# Patient Record
Sex: Female | Born: 1970 | Race: White | Hispanic: No | Marital: Married | Smoking: Former smoker
Health system: Southern US, Community
[De-identification: ages and names within clinical notes are randomized; demographics above are authoritative.]

## PROBLEM LIST (undated history)

## (undated) DIAGNOSIS — I10 Essential (primary) hypertension: Secondary | ICD-10-CM

---

## 2012-03-20 ENCOUNTER — Other Ambulatory Visit: Payer: Self-pay

## 2012-06-02 ENCOUNTER — Other Ambulatory Visit (HOSPITAL_COMMUNITY): Payer: Self-pay | Admitting: Physician Assistant

## 2012-06-02 DIAGNOSIS — I10 Essential (primary) hypertension: Secondary | ICD-10-CM

## 2012-06-02 DIAGNOSIS — O09529 Supervision of elderly multigravida, unspecified trimester: Secondary | ICD-10-CM

## 2012-06-02 DIAGNOSIS — Z3689 Encounter for other specified antenatal screening: Secondary | ICD-10-CM

## 2012-06-04 ENCOUNTER — Ambulatory Visit (HOSPITAL_COMMUNITY)
Admission: RE | Admit: 2012-06-04 | Discharge: 2012-06-04 | Disposition: A | Discharge status: Home / Self Care | Payer: Managed Care, Other (non HMO) | Source: Ambulatory Visit | Attending: Physician Assistant | Admitting: Physician Assistant

## 2012-06-04 ENCOUNTER — Ambulatory Visit (HOSPITAL_COMMUNITY): Admission: RE | Admit: 2012-06-04 | Payer: Managed Care, Other (non HMO) | Source: Ambulatory Visit

## 2012-06-04 ENCOUNTER — Encounter (HOSPITAL_COMMUNITY): Payer: Self-pay

## 2012-06-04 VITALS — BP 133/78 | HR 104 | Wt 242.0 lb

## 2012-06-04 DIAGNOSIS — O10019 Pre-existing essential hypertension complicating pregnancy, unspecified trimester: Secondary | ICD-10-CM | POA: Insufficient documentation

## 2012-06-04 DIAGNOSIS — Z363 Encounter for antenatal screening for malformations: Secondary | ICD-10-CM | POA: Insufficient documentation

## 2012-06-04 DIAGNOSIS — O24919 Unspecified diabetes mellitus in pregnancy, unspecified trimester: Secondary | ICD-10-CM | POA: Insufficient documentation

## 2012-06-04 DIAGNOSIS — I10 Essential (primary) hypertension: Secondary | ICD-10-CM

## 2012-06-04 DIAGNOSIS — O99210 Obesity complicating pregnancy, unspecified trimester: Secondary | ICD-10-CM | POA: Insufficient documentation

## 2012-06-04 DIAGNOSIS — Z3689 Encounter for other specified antenatal screening: Secondary | ICD-10-CM

## 2012-06-04 DIAGNOSIS — O09529 Supervision of elderly multigravida, unspecified trimester: Secondary | ICD-10-CM

## 2012-06-04 DIAGNOSIS — Z1389 Encounter for screening for other disorder: Secondary | ICD-10-CM | POA: Insufficient documentation

## 2012-06-04 DIAGNOSIS — O358XX0 Maternal care for other (suspected) fetal abnormality and damage, not applicable or unspecified: Secondary | ICD-10-CM | POA: Insufficient documentation

## 2012-06-04 DIAGNOSIS — E669 Obesity, unspecified: Secondary | ICD-10-CM | POA: Insufficient documentation

## 2012-06-04 DIAGNOSIS — O34219 Maternal care for unspecified type scar from previous cesarean delivery: Secondary | ICD-10-CM | POA: Insufficient documentation

## 2012-06-04 NOTE — Progress Notes (Signed)
Stacy Clarke had an ultrasound appointment today.  Please see [facility]-OB/GYN report for details.  Comments An active singleton fetus is observed.  Biometry is appropriate for gestational age.  Amniotic fluid volume is normal.  Screening survey of the fetal anatomy was performed and no dysmorphic features are detected, noting that today's evaluation is limited by maternal insonating characteristics.   Impression Active singleton fetus Normal anatomic survey Imaging of the fetal heart, facial profile, are kidneys are suboptimal. Normal amniotic fluid volume.  Recommendations Follow up as clinically indicated.  As a courtesy, I scheduled your patient for follow up evaluation for completion of anatomy (fetal heart, kidneys, facial profile) and interval growth in 4 weeks.  Additionally, your patient was referred to pediatric cardiology for formal fetal echocardiogram at 22-[redacted] weeks GA in context of maternal diabetes.  Rogelia Boga, MD, MS, FACOG Assistant Professor Section of Maternal-Fetal Medicine [facility]

## 2012-07-02 ENCOUNTER — Ambulatory Visit (HOSPITAL_COMMUNITY)
Admission: RE | Admit: 2012-07-02 | Discharge: 2012-07-02 | Disposition: A | Discharge status: Home / Self Care | Payer: Managed Care, Other (non HMO) | Source: Ambulatory Visit | Attending: Obstetrics and Gynecology | Admitting: Obstetrics and Gynecology

## 2012-07-02 ENCOUNTER — Ambulatory Visit (HOSPITAL_COMMUNITY): Payer: 59

## 2012-07-02 DIAGNOSIS — O99210 Obesity complicating pregnancy, unspecified trimester: Secondary | ICD-10-CM | POA: Insufficient documentation

## 2012-07-02 DIAGNOSIS — O34219 Maternal care for unspecified type scar from previous cesarean delivery: Secondary | ICD-10-CM | POA: Insufficient documentation

## 2012-07-02 DIAGNOSIS — Z363 Encounter for antenatal screening for malformations: Secondary | ICD-10-CM | POA: Insufficient documentation

## 2012-07-02 DIAGNOSIS — Z1389 Encounter for screening for other disorder: Secondary | ICD-10-CM | POA: Insufficient documentation

## 2012-07-02 DIAGNOSIS — E669 Obesity, unspecified: Secondary | ICD-10-CM | POA: Insufficient documentation

## 2012-07-02 DIAGNOSIS — O09529 Supervision of elderly multigravida, unspecified trimester: Secondary | ICD-10-CM | POA: Insufficient documentation

## 2012-07-02 DIAGNOSIS — O10019 Pre-existing essential hypertension complicating pregnancy, unspecified trimester: Secondary | ICD-10-CM | POA: Insufficient documentation

## 2012-07-02 DIAGNOSIS — O358XX0 Maternal care for other (suspected) fetal abnormality and damage, not applicable or unspecified: Secondary | ICD-10-CM | POA: Insufficient documentation

## 2012-07-02 DIAGNOSIS — O24919 Unspecified diabetes mellitus in pregnancy, unspecified trimester: Secondary | ICD-10-CM | POA: Insufficient documentation

## 2012-07-02 DIAGNOSIS — I10 Essential (primary) hypertension: Secondary | ICD-10-CM

## 2012-07-02 NOTE — Progress Notes (Signed)
[facility] ultrasound  Indication: 41 yr old [id number] at [redacted]w[redacted]d with chronic hypertension and type II diabetes for follow up ultrasound for fetal growth and complete anatomy.  Findings: 1. Estimated fetal weight is in the 53rd%. 2. Anterior placenta without evidence of previa. 3. Normal amniotic fluid volume. 4. Normal transabdominal cervical length. 5. Again the views of the heart and profile are limited. 6. The remainder of the limited anatomy survey is normal.  Recommendations:  1. Appropriate fetal growth. 2. Limited anatomy survey: patient had fetal echocardiogram that was reportedly normal. Has repeat echocardiogram scheduled at 28 weeks. Will reattempt to complete anatomy on follow up ultrasounds. 3. Hypertension: currently well controlled on labetalol. Recommend fetal growth ultrasounds every 4 weeks. Recommend antenatal testing starting at [redacted] weeks gestation. 4. Diabetes: had fetal echocardiogram. Recommend fetal growth and antenatal testing as above. 5. Advanced maternal age: had normal Harmony screen. 6. Recommend follow up ultrasound in 4 weeks.  Eulis Foster, MD

## 2012-07-28 ENCOUNTER — Other Ambulatory Visit (HOSPITAL_COMMUNITY): Payer: Self-pay | Admitting: Physician Assistant

## 2012-07-28 DIAGNOSIS — O09529 Supervision of elderly multigravida, unspecified trimester: Secondary | ICD-10-CM

## 2012-07-30 ENCOUNTER — Ambulatory Visit (HOSPITAL_COMMUNITY)
Admission: RE | Admit: 2012-07-30 | Discharge: 2012-07-30 | Disposition: A | Discharge status: Home / Self Care | Payer: Managed Care, Other (non HMO) | Source: Ambulatory Visit | Attending: Physician Assistant | Admitting: Physician Assistant

## 2012-07-30 VITALS — BP 119/69 | HR 93 | Wt 249.0 lb

## 2012-07-30 DIAGNOSIS — Z1389 Encounter for screening for other disorder: Secondary | ICD-10-CM | POA: Insufficient documentation

## 2012-07-30 DIAGNOSIS — O09529 Supervision of elderly multigravida, unspecified trimester: Secondary | ICD-10-CM | POA: Insufficient documentation

## 2012-07-30 DIAGNOSIS — Z363 Encounter for antenatal screening for malformations: Secondary | ICD-10-CM | POA: Insufficient documentation

## 2012-07-30 DIAGNOSIS — O99210 Obesity complicating pregnancy, unspecified trimester: Secondary | ICD-10-CM | POA: Insufficient documentation

## 2012-07-30 DIAGNOSIS — O34219 Maternal care for unspecified type scar from previous cesarean delivery: Secondary | ICD-10-CM | POA: Insufficient documentation

## 2012-07-30 DIAGNOSIS — E669 Obesity, unspecified: Secondary | ICD-10-CM | POA: Insufficient documentation

## 2012-07-30 DIAGNOSIS — O358XX0 Maternal care for other (suspected) fetal abnormality and damage, not applicable or unspecified: Secondary | ICD-10-CM | POA: Insufficient documentation

## 2012-07-30 DIAGNOSIS — O10019 Pre-existing essential hypertension complicating pregnancy, unspecified trimester: Secondary | ICD-10-CM | POA: Insufficient documentation

## 2012-07-30 DIAGNOSIS — O24919 Unspecified diabetes mellitus in pregnancy, unspecified trimester: Secondary | ICD-10-CM | POA: Insufficient documentation

## 2012-08-28 ENCOUNTER — Ambulatory Visit (HOSPITAL_COMMUNITY)
Admission: RE | Admit: 2012-08-28 | Discharge: 2012-08-28 | Disposition: A | Discharge status: Home / Self Care | Payer: Managed Care, Other (non HMO) | Source: Ambulatory Visit | Attending: Physician Assistant | Admitting: Physician Assistant

## 2012-08-28 ENCOUNTER — Encounter (HOSPITAL_COMMUNITY): Payer: Self-pay

## 2012-08-28 DIAGNOSIS — O358XX0 Maternal care for other (suspected) fetal abnormality and damage, not applicable or unspecified: Secondary | ICD-10-CM | POA: Insufficient documentation

## 2012-08-28 DIAGNOSIS — I10 Essential (primary) hypertension: Secondary | ICD-10-CM

## 2012-08-28 DIAGNOSIS — E669 Obesity, unspecified: Secondary | ICD-10-CM | POA: Insufficient documentation

## 2012-08-28 DIAGNOSIS — O09529 Supervision of elderly multigravida, unspecified trimester: Secondary | ICD-10-CM | POA: Insufficient documentation

## 2012-08-28 DIAGNOSIS — Z1389 Encounter for screening for other disorder: Secondary | ICD-10-CM | POA: Insufficient documentation

## 2012-08-28 DIAGNOSIS — O34219 Maternal care for unspecified type scar from previous cesarean delivery: Secondary | ICD-10-CM | POA: Insufficient documentation

## 2012-08-28 DIAGNOSIS — Z363 Encounter for antenatal screening for malformations: Secondary | ICD-10-CM | POA: Insufficient documentation

## 2012-08-28 DIAGNOSIS — O24919 Unspecified diabetes mellitus in pregnancy, unspecified trimester: Secondary | ICD-10-CM | POA: Insufficient documentation

## 2012-08-28 DIAGNOSIS — O99210 Obesity complicating pregnancy, unspecified trimester: Secondary | ICD-10-CM | POA: Insufficient documentation

## 2012-08-28 DIAGNOSIS — O10019 Pre-existing essential hypertension complicating pregnancy, unspecified trimester: Secondary | ICD-10-CM | POA: Insufficient documentation

## 2012-08-28 HISTORY — DX: Essential (primary) hypertension: I10

## 2012-08-28 NOTE — Progress Notes (Signed)
Ms. Stacy Clarke  had an ultrasound appointment today.  Please see AS-OB/GYN report for details.  Comments There is an active singleton fetus with no apparent dysmorphic features on today's routine anatomic re-examination.  The biometry suggests a fetus with an EFW at the approximately 56th percentile for gestational age.    Impression Active singleton fetus. Normal interval growth. Normal amniotic fluid volume   Recommendations 1. Repeat interval growth assessment by ultrasound was scheduled for your patient in 4 weeks. 2. Follow as clinically indicated.  Rogelia Boga, MD, MS, FACOG Assistant Professor Section of Maternal-Fetal Medicine [facility]

## 2012-08-28 NOTE — Addendum Note (Signed)
Encounter addended by: Durwin Nora, MD on: 08/28/2012  2:11 PM<BR>     Documentation filed: Inpatient Notes

## 2012-09-03 ENCOUNTER — Other Ambulatory Visit (HOSPITAL_COMMUNITY): Payer: Self-pay | Admitting: Obstetrics and Gynecology

## 2012-09-03 DIAGNOSIS — O09529 Supervision of elderly multigravida, unspecified trimester: Secondary | ICD-10-CM

## 2012-09-04 ENCOUNTER — Ambulatory Visit (HOSPITAL_COMMUNITY)
Admission: RE | Admit: 2012-09-04 | Discharge: 2012-09-04 | Disposition: A | Discharge status: Home / Self Care | Payer: Managed Care, Other (non HMO) | Source: Ambulatory Visit | Attending: Physician Assistant | Admitting: Physician Assistant

## 2012-09-04 ENCOUNTER — Other Ambulatory Visit (HOSPITAL_COMMUNITY): Payer: Self-pay | Admitting: Obstetrics and Gynecology

## 2012-09-04 DIAGNOSIS — O09529 Supervision of elderly multigravida, unspecified trimester: Secondary | ICD-10-CM

## 2012-09-04 DIAGNOSIS — O358XX0 Maternal care for other (suspected) fetal abnormality and damage, not applicable or unspecified: Secondary | ICD-10-CM | POA: Insufficient documentation

## 2012-09-04 DIAGNOSIS — O10019 Pre-existing essential hypertension complicating pregnancy, unspecified trimester: Secondary | ICD-10-CM | POA: Insufficient documentation

## 2012-09-04 DIAGNOSIS — Z1389 Encounter for screening for other disorder: Secondary | ICD-10-CM | POA: Insufficient documentation

## 2012-09-04 DIAGNOSIS — E669 Obesity, unspecified: Secondary | ICD-10-CM | POA: Insufficient documentation

## 2012-09-04 DIAGNOSIS — O24919 Unspecified diabetes mellitus in pregnancy, unspecified trimester: Secondary | ICD-10-CM | POA: Insufficient documentation

## 2012-09-04 DIAGNOSIS — Z363 Encounter for antenatal screening for malformations: Secondary | ICD-10-CM | POA: Insufficient documentation

## 2012-09-04 DIAGNOSIS — O34219 Maternal care for unspecified type scar from previous cesarean delivery: Secondary | ICD-10-CM | POA: Insufficient documentation

## 2012-09-04 NOTE — Progress Notes (Signed)
Stacy Clarke was seen for ultrasound appointment today.  Please see AS-OBGYN report for details.

## 2012-09-10 ENCOUNTER — Ambulatory Visit (HOSPITAL_COMMUNITY): Payer: Managed Care, Other (non HMO)

## 2012-09-15 ENCOUNTER — Other Ambulatory Visit (HOSPITAL_COMMUNITY): Payer: Self-pay | Admitting: Physician Assistant

## 2012-09-15 DIAGNOSIS — O358XX0 Maternal care for other (suspected) fetal abnormality and damage, not applicable or unspecified: Secondary | ICD-10-CM

## 2012-09-15 DIAGNOSIS — O34219 Maternal care for unspecified type scar from previous cesarean delivery: Secondary | ICD-10-CM

## 2012-09-15 DIAGNOSIS — O09529 Supervision of elderly multigravida, unspecified trimester: Secondary | ICD-10-CM

## 2012-09-15 DIAGNOSIS — O24919 Unspecified diabetes mellitus in pregnancy, unspecified trimester: Secondary | ICD-10-CM

## 2012-09-15 DIAGNOSIS — O10019 Pre-existing essential hypertension complicating pregnancy, unspecified trimester: Secondary | ICD-10-CM

## 2012-09-18 ENCOUNTER — Ambulatory Visit (HOSPITAL_COMMUNITY)
Admission: RE | Admit: 2012-09-18 | Discharge: 2012-09-18 | Disposition: A | Discharge status: Home / Self Care | Payer: Managed Care, Other (non HMO) | Source: Ambulatory Visit | Attending: Physician Assistant | Admitting: Physician Assistant

## 2012-09-18 ENCOUNTER — Other Ambulatory Visit (HOSPITAL_COMMUNITY): Payer: Self-pay | Admitting: Physician Assistant

## 2012-09-18 ENCOUNTER — Ambulatory Visit (HOSPITAL_COMMUNITY): Admission: RE | Admit: 2012-09-18 | Payer: Managed Care, Other (non HMO) | Source: Ambulatory Visit

## 2012-09-18 DIAGNOSIS — O10019 Pre-existing essential hypertension complicating pregnancy, unspecified trimester: Secondary | ICD-10-CM | POA: Insufficient documentation

## 2012-09-18 DIAGNOSIS — O34219 Maternal care for unspecified type scar from previous cesarean delivery: Secondary | ICD-10-CM | POA: Insufficient documentation

## 2012-09-18 DIAGNOSIS — E669 Obesity, unspecified: Secondary | ICD-10-CM | POA: Insufficient documentation

## 2012-09-18 DIAGNOSIS — O09529 Supervision of elderly multigravida, unspecified trimester: Secondary | ICD-10-CM

## 2012-09-18 DIAGNOSIS — O24919 Unspecified diabetes mellitus in pregnancy, unspecified trimester: Secondary | ICD-10-CM

## 2012-09-18 DIAGNOSIS — O358XX0 Maternal care for other (suspected) fetal abnormality and damage, not applicable or unspecified: Secondary | ICD-10-CM

## 2014-07-30 IMAGING — US US FETAL BPP W/O NONSTRESS
1 series · 13 of 21 positions shown · non-contrast
Comparison: none

[Series 1: us fetal bpp w/o nonstress · 0.30mm/px · 21 acquisitions, 13 frames shown]
[im 1/21]
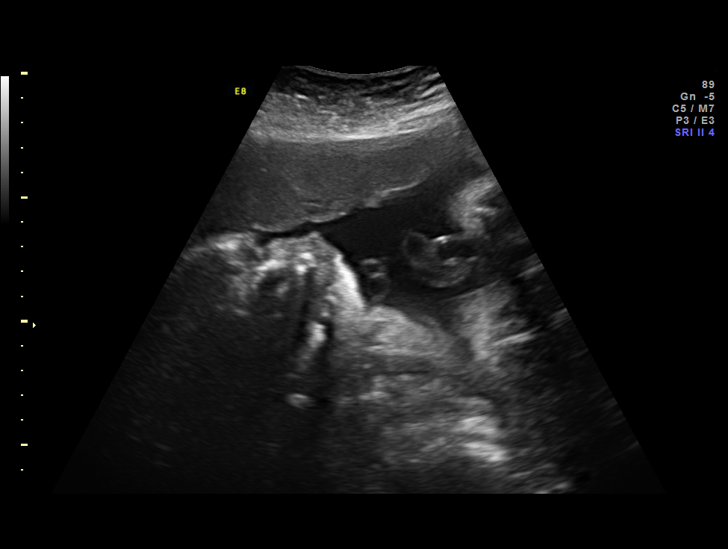
[im 3/21]
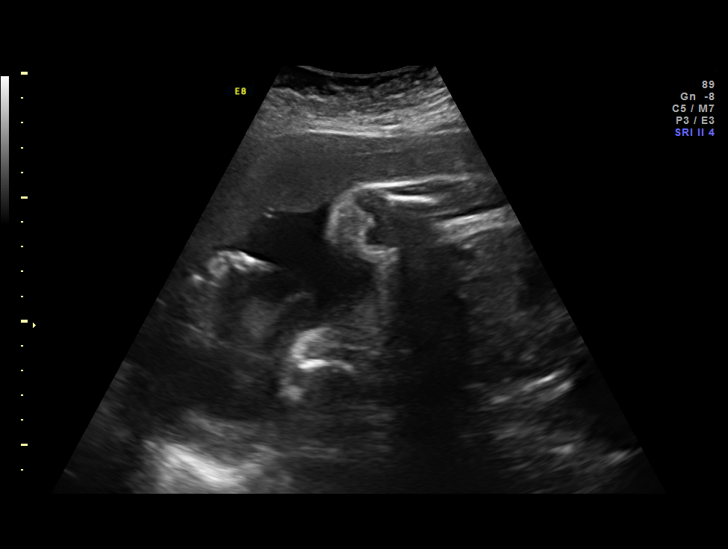
[im 5/21]
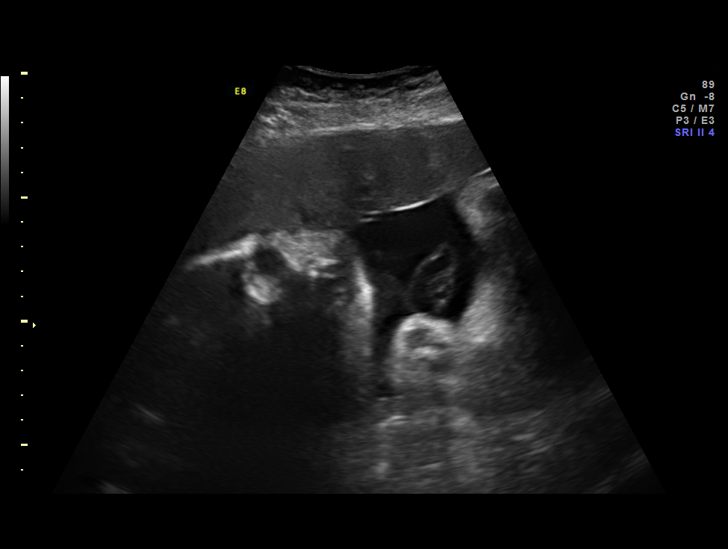
[im 6/21]
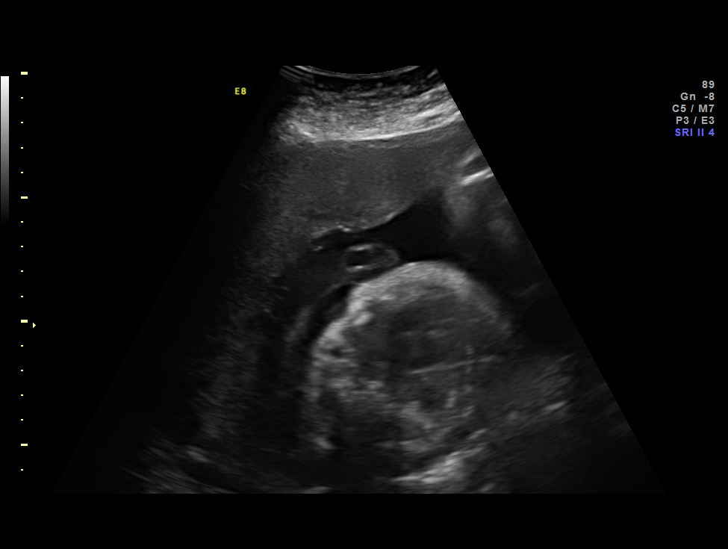
[im 8/21]
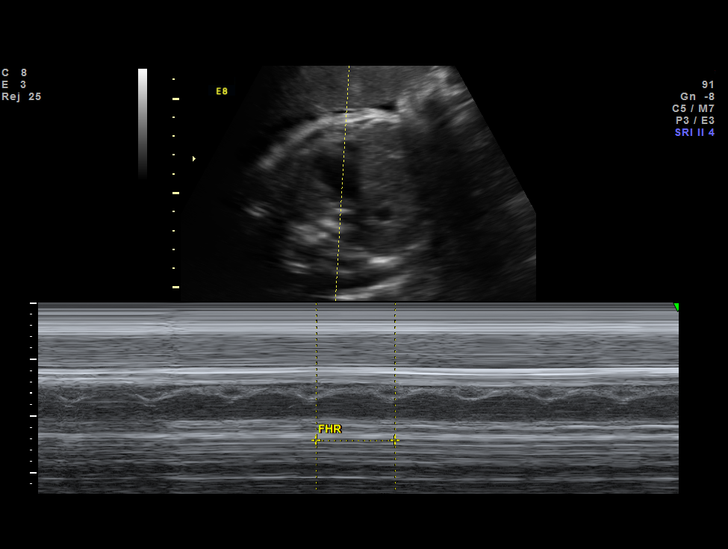
[im 9/21]
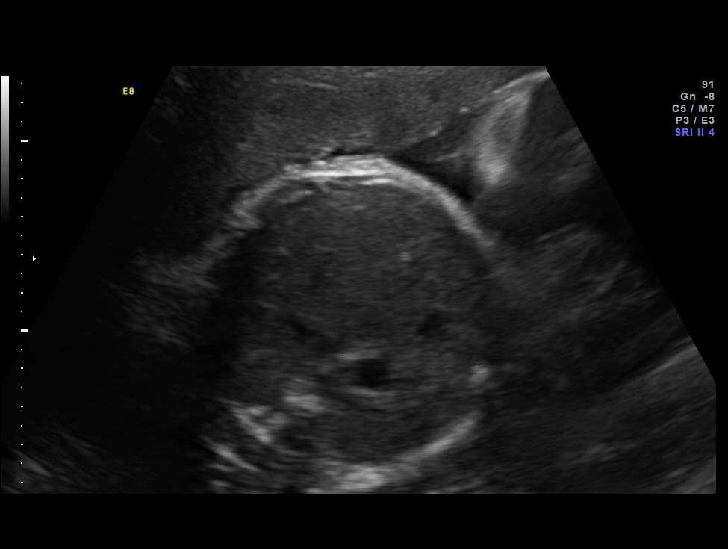
[im 11/21]
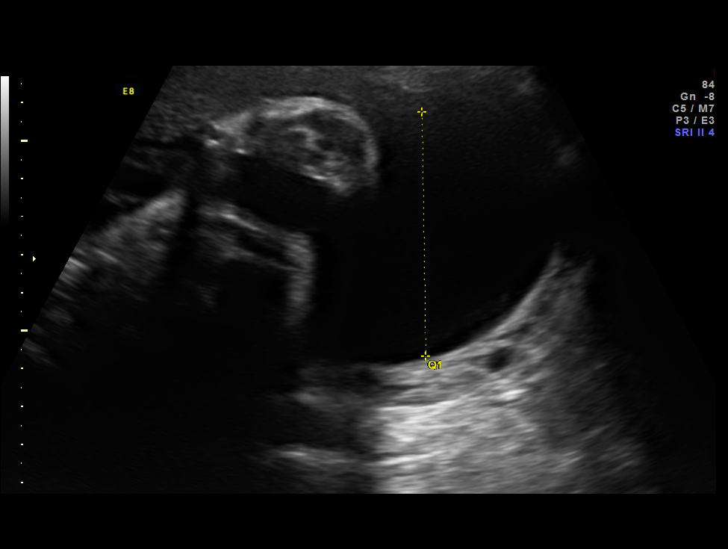
[im 13/21]
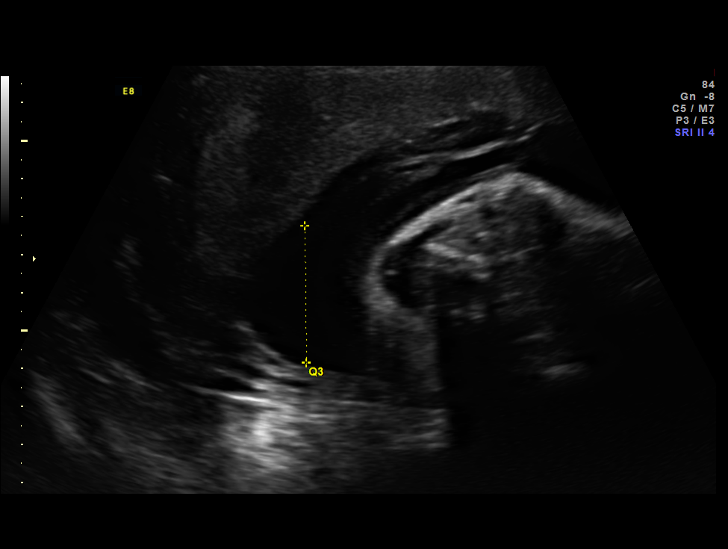
[im 14/21]
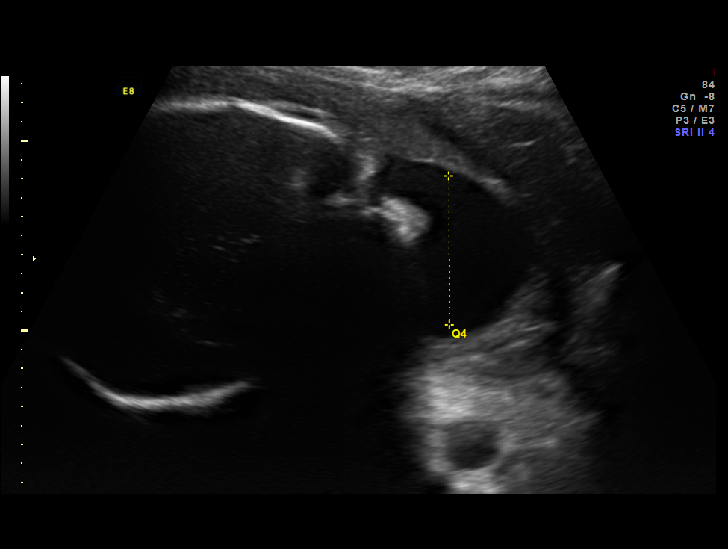
[im 16/21]
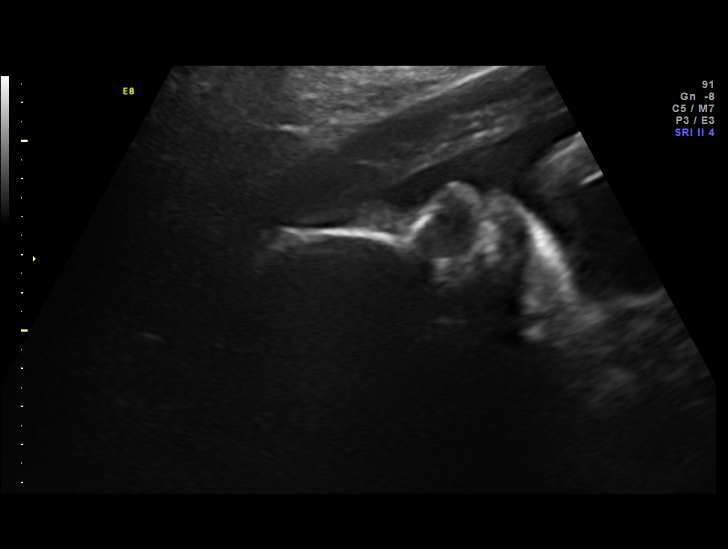
[im 17/21]
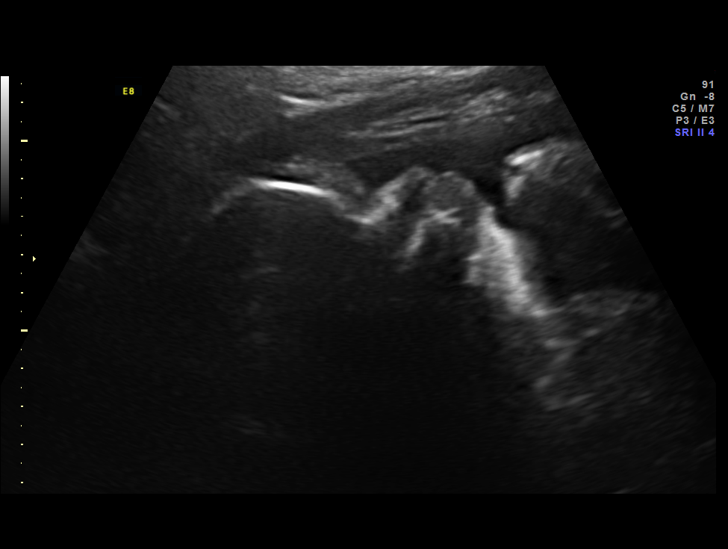
[im 19/21]
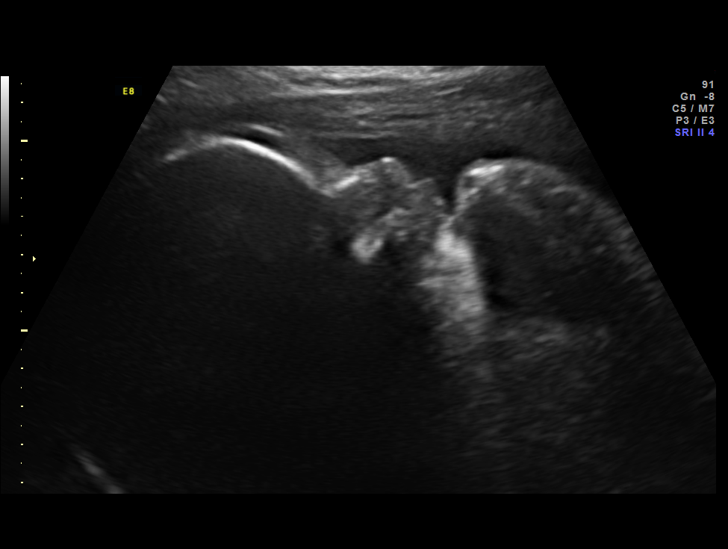
[im 21/21]
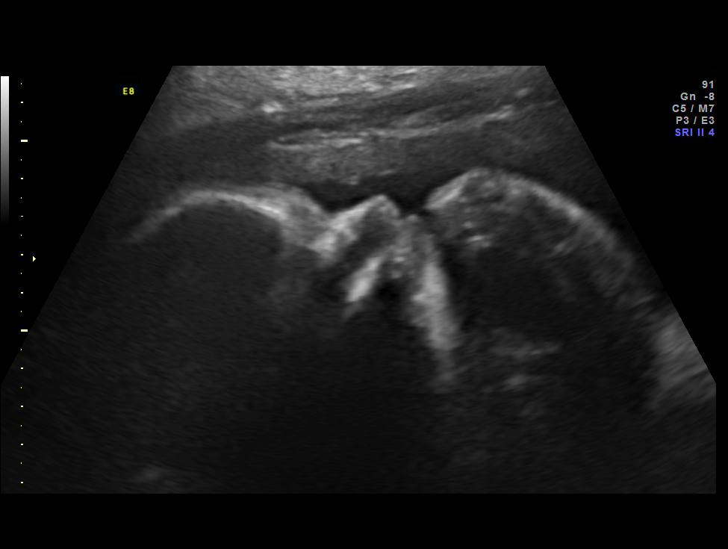

[13 of 21 positions shown; findings below may reference images not displayed]

OBSTETRICS REPORT
                    (Corrected Final 09/07/2012 [DATE])

Service(s) Provided

Indications

 F/u detailed fetal anatomic survey (complete
 anatomy)
 Advanced maternal age (AMA), Multigravida;
 screen negative Harmony
 Diabetes - Pregestational on Metformin
 Hypertension - Chronic/Pre-existing on Labetalol
 Previous cesarean section
 Obesity
Fetal Evaluation

 Num Of Fetuses:    1
 Fetal Heart Rate:  127                          bpm
 Cardiac Activity:  Observed
 Presentation:      Transverse, head to
                    maternal right
 Placenta:          Anterior, above cervical os
 P. Cord            Previously Visualized
 Insertion:

 Amniotic Fluid
 AFI FV:      Subjectively within normal limits
 AFI Sum:     16.28   cm       59  %Tile     Larg Pckt:    6.44  cm
 RUQ:   6.44    cm   RLQ:    3.93   cm    LUQ:   2.3     cm   LLQ:    3.61   cm
Biophysical Evaluation

 Amniotic F.V:   Within normal limits       F. Tone:        Observed
 F. Movement:    Observed                   Score:          [DATE]
 F. Breathing:   Observed
Gestational Age

 LMP:           32w 4d        Date:  01/20/12                 EDD:   10/26/12
 Best:          31w 2d     Det. By:  Early Ultrasound         EDD:   11/04/12
                                     (03/16/12)
Cervix Uterus Adnexa
 Cervix:       Not visualized (advanced GA >83wks)
Impression

 Single living intrauterine pregnancy at 31 weeks 2 days.
 Normal amniotic fluid volume.
 BPP [DATE].
Recommendations

 1. Repeat interval growth assessment by ultrasound was
 scheduled for your patient in 3 weeks.
 2. Continue NSTs twice weekly and weekly AFI.

                 Attending Physician, SANSAO

## 2014-08-01 ENCOUNTER — Encounter (HOSPITAL_COMMUNITY): Payer: Self-pay

## 2016-12-10 LAB — HEMOGLOBIN A1C: HEMOGLOBIN A1C: 9.1

## 2017-01-21 ENCOUNTER — Encounter: Payer: Self-pay | Admitting: "Endocrinology

## 2017-01-21 ENCOUNTER — Ambulatory Visit (INDEPENDENT_AMBULATORY_CARE_PROVIDER_SITE_OTHER): Payer: BLUE CROSS/BLUE SHIELD | Admitting: "Endocrinology

## 2017-01-21 VITALS — BP 129/86 | HR 94 | Ht 66.0 in | Wt 256.0 lb

## 2017-01-21 DIAGNOSIS — I10 Essential (primary) hypertension: Secondary | ICD-10-CM

## 2017-01-21 DIAGNOSIS — IMO0002 Reserved for concepts with insufficient information to code with codable children: Secondary | ICD-10-CM | POA: Insufficient documentation

## 2017-01-21 DIAGNOSIS — E118 Type 2 diabetes mellitus with unspecified complications: Secondary | ICD-10-CM | POA: Diagnosis not present

## 2017-01-21 DIAGNOSIS — E1165 Type 2 diabetes mellitus with hyperglycemia: Secondary | ICD-10-CM

## 2017-01-21 MED ORDER — EMPAGLIFLOZIN 10 MG PO TABS: 10.0000 mg | ORAL_TABLET | Freq: Every day | ORAL | 3 refills | Status: DC

## 2017-01-21 NOTE — Progress Notes (Signed)
Subjective:    Patient ID: Stacy Clarke, female    DOB: [birth year]. Patient is being seen in consultation for management of diabetes requested by  Ernestine Conrad, MD  Past Medical History:  Diagnosis Date  . HTN (hypertension) 08/28/2012   Past Surgical History:  Procedure Laterality Date  . CESAREAN SECTION     Social History   Social History  . Marital status: Married    Spouse name: N/A  . Number of children: N/A  . Years of education: N/A   Social History Main Topics  . Smoking status: Former Games developer  . Smokeless tobacco: Never Used  . Alcohol use No  . Drug use: No  . Sexual activity: Not Asked   Other Topics Concern  . None   Social History Narrative  . None   Outpatient Encounter Prescriptions as of 01/21/2017  Medication Sig  . lisinopril (PRINIVIL,ZESTRIL) 20 MG tablet Take 20 mg by mouth daily.  . ranitidine (ZANTAC) 150 MG tablet Take 150 mg by mouth at bedtime.  . [DISCONTINUED] glipiZIDE (GLUCOTROL) 10 MG tablet Take 10 mg by mouth at bedtime.  . empagliflozin (JARDIANCE) 10 MG TABS tablet Take 10 mg by mouth daily.  . metFORMIN (GLUCOPHAGE) 500 MG tablet Take 1,000 mg by mouth 2 (two) times daily after a meal.  . [DISCONTINUED] FLUoxetine (PROZAC) 40 MG capsule   . [DISCONTINUED] labetalol (NORMODYNE) 100 MG tablet   . [DISCONTINUED] ONE TOUCH ULTRA TEST test strip   . [DISCONTINUED] Prenatal Vit w/Fe-Methylfol-FA (PNV PO) Take by mouth.   No facility-administered encounter medications on file as of 01/21/2017.    ALLERGIES: No Known Allergies VACCINATION STATUS:  There is no immunization history on file for this patient.  Diabetes  She presents for her initial diabetic visit. She has type 2 diabetes mellitus. Onset time: She was diagnosed at approximate age of 46 years after episodes of gestational diabetes with 3 of her pregnancies from 35 until 2014. Her disease course has been worsening. There are no hypoglycemic associated symptoms.  Pertinent negatives for hypoglycemia include no confusion, headaches, pallor or seizures. Associated symptoms include fatigue, polydipsia and polyuria. Pertinent negatives for diabetes include no chest pain and no polyphagia. There are no hypoglycemic complications. Symptoms are worsening. There are no diabetic complications. Risk factors for coronary artery disease include diabetes mellitus, hypertension, obesity and sedentary lifestyle. Current diabetic treatment includes oral agent (dual therapy). Her weight is increasing steadily. She is following a generally unhealthy diet. When asked about meal planning, she reported none. She has not had a previous visit with a dietitian. She never participates in exercise. (She did not bring any meter nor logs to review today. Her most recent A1c was 9.1% on 12/10/2016. ) An ACE inhibitor/angiotensin II receptor blocker is being taken. She does not see a podiatrist.Eye exam is not current.  Hypertension  This is a chronic problem. The current episode started more than 1 year ago. The problem is controlled. Pertinent negatives include no chest pain, headaches, palpitations or shortness of breath. Risk factors for coronary artery disease include diabetes mellitus, obesity and sedentary lifestyle. Past treatments include ACE inhibitors.       Review of Systems  Constitutional: Positive for fatigue. Negative for chills, fever and unexpected weight change.  HENT: Negative for trouble swallowing and voice change.   Eyes: Negative for visual disturbance.  Respiratory: Negative for cough, shortness of breath and wheezing.   Cardiovascular: Negative for chest pain, palpitations and leg swelling.  Gastrointestinal:  Negative for diarrhea, nausea and vomiting.  Endocrine: Positive for polydipsia and polyuria. Negative for cold intolerance, heat intolerance and polyphagia.  Musculoskeletal: Negative for arthralgias and myalgias.  Skin: Negative for color change, pallor,  rash and wound.  Neurological: Negative for seizures and headaches.  Psychiatric/Behavioral: Negative for confusion and suicidal ideas.    Objective:    BP 129/86   Pulse 94   Ht  (1.676 m)   Wt 256 lb (116.1 kg)   Breastfeeding? Unknown   BMI 41.32 kg/m   Wt Readings from Last 3 Encounters:  01/21/17 256 lb (116.1 kg)  09/18/12 246 lb (111.6 kg)  09/04/12 245 lb (111.1 kg)    Physical Exam  Constitutional: She is oriented to person, place, and time. She appears well-developed.  HENT:  Head: Normocephalic and atraumatic.  Eyes: EOM are normal.  Neck: Normal range of motion. Neck supple. No tracheal deviation present. No thyromegaly present.  Cardiovascular: Normal rate and regular rhythm.   Pulmonary/Chest: Effort normal and breath sounds normal.  Abdominal: Soft. Bowel sounds are normal. There is no tenderness. There is no guarding.  Musculoskeletal: Normal range of motion. She exhibits no edema.  Neurological: She is alert and oriented to person, place, and time. She has normal reflexes. No cranial nerve deficit. Coordination normal.  Skin: Skin is warm and dry. No rash noted. No erythema. No pallor.  Psychiatric: She has a normal mood and affect. Judgment normal.    12/10/2013 A1c 9.1%.    Assessment & Plan:   1. Uncontrolled type 2 diabetes mellitus with complication, without long-term current use of insulin (HCC)  - Patient has currently uncontrolled symptomatic type 2 DM since  46 years of age,  with most recent A1c of 9.1 %. Recent labs reviewed.   Her diabetes is complicated by obesity/sedentary life and patient remains at a high risk for more acute and chronic complications of diabetes which include CAD, CVA, CKD, retinopathy, and neuropathy. These are all discussed in detail with the patient.  - I have counseled the patient on diet management and weight loss, by adopting a carbohydrate restricted/protein rich diet.  - Suggestion is made for patient to  avoid simple carbohydrates   from their diet including Cakes , Desserts, Ice Cream,  Soda (  diet and regular) , Sweet Tea , Candies,  Chips, Cookies, Artificial Sweeteners,   and "Sugar-free" Products . This will help patient to have stable blood glucose profile and potentially avoid unintended weight gain.  - I encouraged the patient to switch to  unprocessed or minimally processed complex starch and increased protein intake (animal or plant source), fruits, and vegetables.  - Patient is advised to stick to a routine mealtimes to eat 3 meals  a day and avoid unnecessary snacks ( to snack only to correct hypoglycemia).  - The patient will be scheduled with Norm Salt, RDN, CDE for individualized DM education.  - I have approached patient with the following individualized plan to manage diabetes and patient agrees:   - She is motivated, wants to minimize medications and willing to maximize lifestyle modification. - I will lower and adjust her metformin to 1000 mg by mouth twice a day, therapeutically suitable for patient. - I will discontinue glipizide, risk outweighs benefit for this patient. - I have discussed and added Jardiance 10 mg by mouth every morning.   - Patient will be considered for incretin therapy as appropriate next visit. - Patient specific target  A1c;  LDL, HDL,  Triglycerides, and  Waist Circumference were discussed in detail.  2) BP/HTN: Controlled. Continue current medications including ACEI/ARB. 3) Lipids/HPL:   Control unknown. She will have fasting lipid panel before her next visit.  4)  Weight/Diet: CDE Consult will be initiated , exercise, and detailed carbohydrates information provided.  5) Chronic Care/Health Maintenance:  -Patient is on ACEI and encouraged to continue to follow up with Ophthalmology, Podiatrist at least yearly or according to recommendations, and advised to   stay away from smoking. I have recommended yearly flu vaccine and pneumonia  vaccination at least every 5 years; moderate intensity exercise for up to 150 minutes weekly; and  sleep for at least 7 hours a day.  - 60 minutes of time was spent on the care of this patient , 50% of which was applied for counseling on diabetes complications and their preventions.  - I advised patient to maintain close follow up with Ernestine Conrad, MD for primary care needs.  Follow up plan: - Return in about 9 weeks (around 03/25/2017) for follow up with pre-visit labs.  Marquis Lunch, MD Phone: [phone]  Fax: [phone]   01/21/2017, 3:52 PM

## 2017-01-21 NOTE — Patient Instructions (Signed)

## 2017-01-23 ENCOUNTER — Telehealth: Payer: Self-pay

## 2017-01-23 MED ORDER — CANAGLIFLOZIN 100 MG PO TABS: 100.0000 mg | ORAL_TABLET | Freq: Every day | ORAL | 3 refills | Status: DC

## 2017-01-23 NOTE — Telephone Encounter (Signed)
rx sent

## 2017-01-23 NOTE — Telephone Encounter (Signed)
We can switch it to Invokana  po q AM.

## 2017-01-23 NOTE — Telephone Encounter (Signed)
Pts insurance does not cover Jardiance. invokana is preferred.

## 2017-02-06 ENCOUNTER — Encounter: Payer: BLUE CROSS/BLUE SHIELD | Attending: "Endocrinology | Admitting: Nutrition

## 2017-02-06 VITALS — Ht 66.0 in | Wt 248.0 lb

## 2017-02-06 DIAGNOSIS — E118 Type 2 diabetes mellitus with unspecified complications: Principal | ICD-10-CM

## 2017-02-06 DIAGNOSIS — E1165 Type 2 diabetes mellitus with hyperglycemia: Secondary | ICD-10-CM

## 2017-02-06 DIAGNOSIS — IMO0002 Reserved for concepts with insufficient information to code with codable children: Secondary | ICD-10-CM

## 2017-02-06 NOTE — Progress Notes (Signed)
  Medical Nutrition Therapy:  Appt start time: 0800 end time:  0900.   Assessment:  Primary concerns today: Type 2 DM. Here with her mother in law.  H/o Gestational DM x 3 pregnancies.  Lost 8 lbs since visit with Dr. Fransico HimNIda.  Lives with her husband and 4 sons.  She does the cooking and her husband does the shopping. Most foods are baked, broiled and grilled.  Not testing right now per Dr. Fransico HimNida, Endocrinologist's instructions. Eats 3 meals per day.  Physical actvity: 30 mintues daily x 2 weeks.  Changes made: Drinking water- 120 oz, cut out sweets, cakes, junk food or articifical sweeteners. Diet was high in processed foods, sweets and sodas and diet sodas. Walking some now 30 minutes a day.  Motivated to make changes with diet and exercise to lose weight and improve blood sugars. A1C 9.1% 3/18. Will get A1C done in June 2018.  Wt Readings from Last 3 Encounters:  02/06/17 248 lb (112.5 kg)  01/21/17 256 lb (116.1 kg)  09/18/12 246 lb (111.6 kg)   Ht Readings from Last 3 Encounters:  02/06/17 5\' 6"  (1.676 m)  01/21/17 5\' 6"  (1.676 m)   Body mass index is 40.03 kg/m.  Lab Results  Component Value Date   HGBA1C 9.1 12/10/2016   Preferred Learning Style:  No preference indicated   Learning Readiness:   Ready  Change in progress   MEDICATIONS:    DIETARY INTAKE: 24-hr recall:  B ( AM):  Half a bagel, egg and fruit, water Snk ( AM):  Not usually or veggies sometimes. L ( PM): Pita bread,  1/4 spinach and articoke dip, green pepper, cucumbers,water Snk ( PM):  D ( PM): Teriaki chicken with rice and broccoli, waqter Snk ( PM):  Beverages: water  Usual physical activity: Walking 30 minute a day  Estimated energy needs: 1800  calories 200 g carbohydrates 135 g protein 50 g fat  Progress Towards Goal(s):  In progress.   Nutritional Diagnosis:  NB-1.1 Food and nutrition-related knowledge deficit As related to Diabetes.  As evidenced by A1C 9.1%..    Intervention:   Nutrition and Diabetes education provided on My Plate, CHO counting, meal planning, portion sizes, timing of meals, avoiding snacks between meals unless having a low blood sugar, target ranges for A1C and blood sugars, signs/symptoms and treatment of hyper/hypoglycemia, monitoring blood sugars, taking medications as prescribed, benefits of exercising 30 minutes per day and prevention of complications of DM. Goals 1. Follow Plate Method 2. Start meal planning 3. Keep drinking water 4. Walk 30 minutes a day 5. Eat 30-45 carbs per meal 6. Avoid processed foods 7. Lose 1-2 lbs per week To get A1C down to 7% in 3 months  Teaching Method Utilized:  Visual Auditory Hands on  Handouts given during visit include:  The Plate Method  Meal Plan Card  Diabetes Instructions.   Barriers to learning/adherence to lifestyle change: none  Demonstrated degree of understanding via:  Teach Back   Monitoring/Evaluation:  Dietary intake, exercise, meal planning, and body weight in 1 month(s).

## 2017-02-06 NOTE — Patient Instructions (Signed)
Goals 1. Follow Plate Method 2. Start meal planning 3. Keep drinking water 4. Walk 30 minutes a day 5. Eat 30-45 carbs per meal 6. Avoid processed foods 7. Lose 1-2 lbs per week To get A1C down to 7% in 3 months

## 2017-03-12 ENCOUNTER — Encounter: Payer: Self-pay | Admitting: "Endocrinology

## 2017-03-12 LAB — TSH: TSH: 2.91 (ref <=5.90)

## 2017-03-12 LAB — BASIC METABOLIC PANEL
BUN: 24 — AB (ref 4–21)
CREATININE: 0.8 (ref <=1.1)

## 2017-03-12 LAB — HEMOGLOBIN A1C: HEMOGLOBIN A1C: 7.5

## 2017-03-19 ENCOUNTER — Encounter: Payer: Self-pay | Admitting: "Endocrinology

## 2017-03-19 ENCOUNTER — Encounter: Payer: BLUE CROSS/BLUE SHIELD | Attending: Family Medicine | Admitting: Nutrition

## 2017-03-19 ENCOUNTER — Ambulatory Visit (INDEPENDENT_AMBULATORY_CARE_PROVIDER_SITE_OTHER): Payer: BLUE CROSS/BLUE SHIELD | Admitting: "Endocrinology

## 2017-03-19 VITALS — BP 122/78 | HR 82 | Ht 66.0 in | Wt 240.0 lb

## 2017-03-19 VITALS — Ht 66.0 in | Wt 240.0 lb

## 2017-03-19 DIAGNOSIS — IMO0002 Reserved for concepts with insufficient information to code with codable children: Secondary | ICD-10-CM

## 2017-03-19 DIAGNOSIS — E1165 Type 2 diabetes mellitus with hyperglycemia: Secondary | ICD-10-CM

## 2017-03-19 DIAGNOSIS — E118 Type 2 diabetes mellitus with unspecified complications: Secondary | ICD-10-CM | POA: Diagnosis not present

## 2017-03-19 DIAGNOSIS — I10 Essential (primary) hypertension: Secondary | ICD-10-CM

## 2017-03-19 MED ORDER — ATORVASTATIN CALCIUM 20 MG PO TABS: 20.0000 mg | ORAL_TABLET | Freq: Every day | ORAL | 1 refills | Status: DC

## 2017-03-19 NOTE — Patient Instructions (Signed)

## 2017-03-19 NOTE — Progress Notes (Signed)
Subjective:    Patient ID: Stacy Clarke, female    DOB: 06-08-1971. Patient is being seen in consultation for management of diabetes requested by  No primary care provider on file.  Past Medical History:  Diagnosis Date  . HTN (hypertension) 08/28/2012   Past Surgical History:  Procedure Laterality Date  . CESAREAN SECTION     Social History   Social History  . Marital status: Married    Spouse name: N/A  . Number of children: N/A  . Years of education: N/A   Social History Main Topics  . Smoking status: Former Games developer  . Smokeless tobacco: Never Used  . Alcohol use No  . Drug use: No  . Sexual activity: Not on file   Other Topics Concern  . Not on file   Social History Narrative  . No narrative on file   Outpatient Encounter Prescriptions as of 03/19/2017  Medication Sig  . atorvastatin (LIPITOR) 20 MG tablet Take 1 tablet (20 mg total) by mouth daily.  . canagliflozin (INVOKANA) 100 MG TABS tablet Take 1 tablet (100 mg total) by mouth daily before breakfast.  . lisinopril (PRINIVIL,ZESTRIL) 20 MG tablet Take 20 mg by mouth daily.  . metFORMIN (GLUCOPHAGE) 500 MG tablet Take 1,000 mg by mouth 2 (two) times daily after a meal.  . PARoxetine (PAXIL) 20 MG tablet Take 20 mg by mouth daily.  . ranitidine (ZANTAC) 150 MG tablet Take 150 mg by mouth at bedtime.  . [DISCONTINUED] empagliflozin (JARDIANCE) 10 MG TABS tablet Take 10 mg by mouth daily. (Patient not taking: Reported on 02/06/2017)   No facility-administered encounter medications on file as of 03/19/2017.    ALLERGIES: No Known Allergies VACCINATION STATUS:  There is no immunization history on file for this patient.  Diabetes  She presents for her follow-up diabetic visit. She has type 2 diabetes mellitus. Onset time: She was diagnosed at approximate age of 30 years after episodes of gestational diabetes with 3 of her pregnancies from 35 until 2014. Her disease course has been improving. There are no  hypoglycemic associated symptoms. Pertinent negatives for hypoglycemia include no confusion, headaches, pallor or seizures. Associated symptoms include fatigue. Pertinent negatives for diabetes include no chest pain, no polydipsia, no polyphagia and no polyuria. There are no hypoglycemic complications. Symptoms are improving. There are no diabetic complications. Risk factors for coronary artery disease include diabetes mellitus, hypertension, obesity and sedentary lifestyle. Current diabetic treatment includes oral agent (dual therapy). Her weight is decreasing steadily. She is following a diabetic diet. When asked about meal planning, she reported none. She has not had a previous visit with a dietitian. She never participates in exercise. An ACE inhibitor/angiotensin II receptor blocker is being taken. She does not see a podiatrist.Eye exam is not current.  Hypertension  This is a chronic problem. The current episode started more than 1 year ago. The problem is controlled. Pertinent negatives include no chest pain, headaches, palpitations or shortness of breath. Risk factors for coronary artery disease include diabetes mellitus, obesity and sedentary lifestyle. Past treatments include ACE inhibitors.       Review of Systems  Constitutional: Positive for fatigue. Negative for chills, fever and unexpected weight change.  HENT: Negative for trouble swallowing and voice change.   Eyes: Negative for visual disturbance.  Respiratory: Negative for cough, shortness of breath and wheezing.   Cardiovascular: Negative for chest pain, palpitations and leg swelling.  Gastrointestinal: Negative for diarrhea, nausea and vomiting.  Endocrine: Negative for  cold intolerance, heat intolerance, polydipsia, polyphagia and polyuria.  Musculoskeletal: Negative for arthralgias and myalgias.  Skin: Negative for color change, pallor, rash and wound.  Neurological: Negative for seizures and headaches.   Psychiatric/Behavioral: Negative for confusion and suicidal ideas.    Objective:    BP 122/78   Pulse 82   Ht 5\' 6"  (1.676 m)   Wt 240 lb (108.9 kg)   BMI 38.74 kg/m   Wt Readings from Last 3 Encounters:  03/19/17 240 lb (108.9 kg)  03/19/17 240 lb (108.9 kg)  02/06/17 248 lb (112.5 kg)    Physical Exam  Constitutional: She is oriented to person, place, and time. She appears well-developed.  HENT:  Head: Normocephalic and atraumatic.  Eyes: EOM are normal.  Neck: Normal range of motion. Neck supple. No tracheal deviation present. No thyromegaly present.  Cardiovascular: Normal rate and regular rhythm.   Pulmonary/Chest: Effort normal and breath sounds normal.  Abdominal: Soft. Bowel sounds are normal. There is no tenderness. There is no guarding.  Musculoskeletal: Normal range of motion. She exhibits no edema.  Neurological: She is alert and oriented to person, place, and time. She has normal reflexes. No cranial nerve deficit. Coordination normal.  Skin: Skin is warm and dry. No rash noted. No erythema. No pallor.  Psychiatric: She has a normal mood and affect. Judgment normal.    12/10/2013 A1c 9.1%.    Assessment & Plan:   1. Uncontrolled type 2 diabetes mellitus with complication, without long-term current use of insulin (HCC)  - Patient has currently uncontrolled symptomatic type 2 DM since  46 years of age. - She came with improved A1c of 1.5% from 9.1%. Recent labs reviewed.   Her diabetes is complicated by obesity/sedentary life and patient remains at a high risk for more acute and chronic complications of diabetes which include CAD, CVA, CKD, retinopathy, and neuropathy. These are all discussed in detail with the patient.  - I have counseled the patient on diet management and weight loss, by adopting a carbohydrate restricted/protein rich diet.  - Suggestion is made for patient to avoid simple carbohydrates   from her diet including Cakes , Desserts, Ice  Cream,  Soda (  diet and regular) , Sweet Tea , Candies,  Chips, Cookies, Artificial Sweeteners,   and "Sugar-free" Products . This will help patient to have stable blood glucose profile and potentially avoid unintended weight gain.  - I encouraged the patient to switch to  unprocessed or minimally processed complex starch and increased protein intake (animal or plant source), fruits, and vegetables.  - Patient is advised to stick to a routine mealtimes to eat 3 meals  a day and avoid unnecessary snacks ( to snack only to correct hypoglycemia).  - The patient will be scheduled with Norm Salt, RDN, CDE for individualized DM education.  - I have approached patient with the following individualized plan to manage diabetes and patient agrees:   - She is motivated, wants to minimize medications and willing to maximize lifestyle modification. - I will continue  metformin  1000 mg by mouth twice a day, therapeutically suitable for patient.  - I  will continue  Invokana 100 mg by mouth every morning. She is tolerating this medication and has benefited with significant weight loss.    - Patient will be considered for incretin therapy as appropriate next visit. - Patient specific target  A1c;  LDL, HDL, Triglycerides, and  Waist Circumference were discussed in detail.  2) BP/HTN: Controlled. Continue  current medications including ACEI/ARB. 3) Lipids/HPL:   uncontroled , LDL 121. I discussed and added atorvastatin 20 mg by mouth daily at bedtime. Side effects and precautions discussed with her.  4)  Weight/Diet: CDE Consult has been initiated , exercise, and detailed carbohydrates information provided.  5) Chronic Care/Health Maintenance:  -Patient is on ACEI and encouraged to continue to follow up with Ophthalmology, Podiatrist at least yearly or according to recommendations, and advised to   stay away from smoking. I have recommended yearly flu vaccine and pneumonia vaccination at least every 5  years; moderate intensity exercise for up to 150 minutes weekly; and  sleep for at least 7 hours a day.  - 30 minutes of time was spent on the care of this patient , 50% of which was applied for counseling on diabetes complications and their preventions.  - I advised patient to maintain close follow up with for primary care needs.  Follow up plan: - Return in about 4 months (around 07/19/2017) for follow up with pre-visit labs.  Marquis LunchGebre Lelynd Poer, MD Phone: [phone]  Fax: [phone]   03/19/2017, 5:14 PM

## 2017-03-19 NOTE — Progress Notes (Signed)
  Medical Nutrition Therapy:  Appt start time: 0800 end time:  0900.   Assessment:  Primary concerns today: Type 2 DM. Here with her mother in law.   Drinking water,  Cut out snacks,  And walking 30 minutes daily and now going to gym. Lost 16 lbs in the last 2 months. A1C down to 7.5% from >9%. Feels much better. Diet is much better and more consistent with balanced carbs.    Wt Readings from Last 3 Encounters:  03/19/17 240 lb (108.9 kg)  03/19/17 240 lb (108.9 kg)  02/06/17 248 lb (112.5 kg)   Ht Readings from Last 3 Encounters:  03/19/17 5\' 6"  (1.676 m)  03/19/17 5\' 6"  (1.676 m)  02/06/17 5\' 6"  (1.676 m)   Body mass index is 38.74 kg/m.  Lab Results  Component Value Date   HGBA1C 7.5 03/12/2017   Preferred Learning Style:  No preference indicated   Learning Readiness:   Ready  Change in progress   MEDICATIONS:    DIETARY INTAKE: 24-hr recall:  B ( AM):  Small bagel, 2 eggs and fruit, water Snk ( AM):  Not usually or veggies sometimes. L ( PM): Alfredo chicken 1 cup, water Snk ( PM):  D ( PM): Meat and vegetables,  Or small bowl of cereal, water Snk ( PM):  Beverages: water  Usual physical activity: Walking 30 minute a day  Estimated energy needs: 1800  calories 200 g carbohydrates 135 g protein 50 g fat  Progress Towards Goal(s):  In progress.   Nutritional Diagnosis:  NB-1.1 Food and nutrition-related knowledge deficit As related to Diabetes.  As evidenced by A1C 9.1%..    Intervention:  Nutrition and Diabetes education provided on My Plate, CHO counting, meal planning, portion sizes, timing of meals, avoiding snacks between meals unless having a low blood sugar, target ranges for A1C and blood sugars, signs/symptoms and treatment of hyper/hypoglycemia, monitoring blood sugars, taking medications as prescribed, benefits of exercising 30 minutes per day and prevention of complications of DM.  Goals 1. Get A1C down to 6.5% or less 2. Lose 1 lb per  week 3. Increase fresh fruits and low carb vegetables. Take meds as prescribed.   Teaching Method Utilized:  Visual Auditory Hands on  Handouts given during visit include:  The Plate Method  Meal Plan Card  Diabetes Instructions.   Barriers to learning/adherence to lifestyle change: none  Demonstrated degree of understanding via:  Teach Back   Monitoring/Evaluation:  Dietary intake, exercise, meal planning, and body weight in 3 month(s).

## 2017-03-19 NOTE — Patient Instructions (Addendum)
Goals 1. Get A1C down to 6.5% or less 2. Lose 1 lb per week 3. Increase fresh fruits and low carb vegetables. Take meds as prescribed.

## 2017-05-17 ENCOUNTER — Other Ambulatory Visit: Payer: Self-pay | Admitting: "Endocrinology

## 2017-05-26 ENCOUNTER — Other Ambulatory Visit: Payer: Self-pay

## 2017-05-26 MED ORDER — CANAGLIFLOZIN 100 MG PO TABS: 100.0000 mg | ORAL_TABLET | Freq: Every day | ORAL | 1 refills | Status: DC

## 2017-07-14 ENCOUNTER — Encounter: Payer: Self-pay | Admitting: "Endocrinology

## 2017-07-14 LAB — LIPID PANEL
CHOLESTEROL: 146 (ref 0–200)
HDL: 53 (ref 35–70)
LDL Cholesterol: 77
TRIGLYCERIDES: 78 (ref 40–160)

## 2017-07-14 LAB — HEMOGLOBIN A1C: Hemoglobin A1C: 7.6

## 2017-07-14 LAB — BASIC METABOLIC PANEL
BUN: 14 (ref 4–21)
CREATININE: 0.7 (ref <=1.1)

## 2017-07-21 ENCOUNTER — Encounter: Payer: BLUE CROSS/BLUE SHIELD | Attending: Family Medicine | Admitting: Nutrition

## 2017-07-21 ENCOUNTER — Encounter: Payer: Self-pay | Admitting: "Endocrinology

## 2017-07-21 ENCOUNTER — Ambulatory Visit (INDEPENDENT_AMBULATORY_CARE_PROVIDER_SITE_OTHER): Payer: BLUE CROSS/BLUE SHIELD | Admitting: "Endocrinology

## 2017-07-21 VITALS — BP 135/84 | HR 86 | Ht 66.0 in | Wt 228.0 lb

## 2017-07-21 VITALS — Ht 66.0 in | Wt 228.0 lb

## 2017-07-21 DIAGNOSIS — E6609 Other obesity due to excess calories: Secondary | ICD-10-CM

## 2017-07-21 DIAGNOSIS — E1165 Type 2 diabetes mellitus with hyperglycemia: Secondary | ICD-10-CM

## 2017-07-21 DIAGNOSIS — E669 Obesity, unspecified: Secondary | ICD-10-CM

## 2017-07-21 DIAGNOSIS — I10 Essential (primary) hypertension: Secondary | ICD-10-CM

## 2017-07-21 DIAGNOSIS — IMO0002 Reserved for concepts with insufficient information to code with codable children: Secondary | ICD-10-CM

## 2017-07-21 DIAGNOSIS — E118 Type 2 diabetes mellitus with unspecified complications: Principal | ICD-10-CM

## 2017-07-21 MED ORDER — DULAGLUTIDE 0.75 MG/0.5ML ~~LOC~~ SOAJ: 0.7500 mg | SUBCUTANEOUS | 2 refills | Status: DC

## 2017-07-21 NOTE — Progress Notes (Signed)
  Medical Nutrition Therapy:  Appt start time: 1530 end time: 1545   Assessment:  Primary concerns today: Type 2 DM. F/U Changes made:  eating more fruit and low carb veggies for snacks. Lost 12 lbs   Physical activity:  Has been working on exercising more-3 times per week Saw Dr. Fransico HimNida today. A1C 7.4%.  LDL 77 down from 11 mg/dl. Feels better. Not testing anymore per Dr. Fransico HimNida. Changed from Invokana to Trulicity today per Dr. Fransico HimNida. Counting carbs and reading food labels. Making slow and steady progress.   Wt Readings from Last 3 Encounters:  07/21/17 228 lb (103.4 kg)  07/21/17 228 lb (103.4 kg)  03/19/17 240 lb (108.9 kg)   Ht Readings from Last 3 Encounters:  07/21/17 5\' 6"  (1.676 m)  07/21/17 5\' 6"  (1.676 m)  03/19/17 5\' 6"  (1.676 m)   Body mass index is 36.8 kg/m.  Lab Results  Component Value Date   HGBA1C 7.5 03/12/2017   Preferred Learning Style:  No preference indicated   Learning Readiness:   Ready  Change in progress   MEDICATIONS:    DIETARY INTAKE: 24-hr recall:  B ( AM): blueberry  Bagel or cheerios or  and hard boiled egg and bacon, Snk ( AM):  L ( PM): Cucumbers and mozzarella cheese;  Pork chop BBQ green beans and mashed potatoes, water, wine Snk ( PM):  D ( PM):  Was full from lunch. Snk ( PM):  Beverages: water  Usual physical activity: Walking 30 minute a day  Estimated energy needs: 1800  calories 200 g carbohydrates 135 g protein 50 g fat  Progress Towards Goal(s):  In progress.   Nutritional Diagnosis:  NB-1.1 Food and nutrition-related knowledge deficit As related to Diabetes.  As evidenced by A1C 9.1%..    Intervention:  Nutrition and Diabetes education provided on My Plate, CHO counting, meal planning, portion sizes, timing of meals, avoiding snacks between meals unless having a low blood sugar, target ranges for A1C and blood sugars, signs/symptoms and treatment of hyper/hypoglycemia, monitoring blood sugars, taking  medications as prescribed, benefits of exercising 30 minutes per day and prevention of complications of DM.   Goals 1. Get back to going to gym 60-90 minutes 3 times per week 2. Lose 10 lbs next 2-3 months. 3. Try to avoid skipping meals 4. Get A1C to 7% or lower. Stop Invokana and start Trulicity.  Teaching Method Utilized:  Visual Auditory Hands on  Handouts given during visit include:  The Plate Method  Meal Plan Card  Diabetes Instructions.   Barriers to learning/adherence to lifestyle change: none  Demonstrated degree of understanding via:  Teach Back   Monitoring/Evaluation:  Dietary intake, exercise, meal planning, and body weight in 3 month(s).

## 2017-07-21 NOTE — Progress Notes (Signed)
Subjective:    Patient ID: Stacy Clarke, female    DOB: [birth year]. Patient is being seen in f/u for management of diabetes requested by  Joaquin Courts, DO  Past Medical History:  Diagnosis Date  . HTN (hypertension) 08/28/2012   Past Surgical History:  Procedure Laterality Date  . CESAREAN SECTION     Social History   Social History  . Marital status: Married    Spouse name: N/A  . Number of children: N/A  . Years of education: N/A   Social History Main Topics  . Smoking status: Former Games developer  . Smokeless tobacco: Never Used  . Alcohol use No  . Drug use: No  . Sexual activity: Not Asked   Other Topics Concern  . None   Social History Narrative  . None   Outpatient Encounter Prescriptions as of 07/21/2017  Medication Sig  . atorvastatin (LIPITOR) 20 MG tablet Take 1 tablet (20 mg total) by mouth daily.  . Dulaglutide (TRULICITY) 0.75 MG/0.5ML SOPN Inject 0.75 mg into the skin once a week.  Marland Kitchen lisinopril (PRINIVIL,ZESTRIL) 20 MG tablet Take 20 mg by mouth daily.  . metFORMIN (GLUCOPHAGE) 500 MG tablet Take 1,000 mg by mouth 2 (two) times daily after a meal.  . PARoxetine (PAXIL) 20 MG tablet Take 20 mg by mouth daily.  . ranitidine (ZANTAC) 150 MG tablet Take 150 mg by mouth at bedtime.  . [DISCONTINUED] canagliflozin (INVOKANA) 100 MG TABS tablet Take 1 tablet (100 mg total) by mouth daily before breakfast.   No facility-administered encounter medications on file as of 07/21/2017.    ALLERGIES: No Known Allergies VACCINATION STATUS:  There is no immunization history on file for this patient.  Diabetes  She presents for her follow-up diabetic visit. She has type 2 diabetes mellitus. Onset time: She was diagnosed at approximate age of 54 years after episodes of gestational diabetes with 3 of her pregnancies from 30 until 2014. Her disease course has been improving. There are no hypoglycemic associated symptoms. Pertinent negatives for hypoglycemia  include no confusion, headaches, pallor or seizures. Associated symptoms include fatigue. Pertinent negatives for diabetes include no chest pain, no polydipsia, no polyphagia and no polyuria. There are no hypoglycemic complications. Symptoms are improving. There are no diabetic complications. Risk factors for coronary artery disease include diabetes mellitus, hypertension, obesity and sedentary lifestyle. Current diabetic treatment includes oral agent (dual therapy). Her weight is decreasing steadily. She is following a diabetic diet. When asked about meal planning, she reported none. She has not had a previous visit with a dietitian. She never participates in exercise. An ACE inhibitor/angiotensin II receptor blocker is being taken. She does not see a podiatrist.Eye exam is not current.  Hypertension  This is a chronic problem. The current episode started more than 1 year ago. The problem is controlled. Pertinent negatives include no chest pain, headaches, palpitations or shortness of breath. Risk factors for coronary artery disease include diabetes mellitus, obesity and sedentary lifestyle. Past treatments include ACE inhibitors.     Review of Systems  Constitutional: Positive for fatigue. Negative for chills, fever and unexpected weight change.  HENT: Negative for trouble swallowing and voice change.   Eyes: Negative for visual disturbance.  Respiratory: Negative for cough, shortness of breath and wheezing.   Cardiovascular: Negative for chest pain, palpitations and leg swelling.  Gastrointestinal: Negative for diarrhea, nausea and vomiting.  Endocrine: Negative for cold intolerance, heat intolerance, polydipsia, polyphagia and polyuria.  Musculoskeletal: Negative for arthralgias and  myalgias.  Skin: Negative for color change, pallor, rash and wound.  Neurological: Negative for seizures and headaches.  Psychiatric/Behavioral: Negative for confusion and suicidal ideas.    Objective:    BP  135/84   Pulse 86   Ht 5\' 6"  (1.676 m)   Wt 228 lb (103.4 kg)   BMI 36.80 kg/m   Wt Readings from Last 3 Encounters:  07/21/17 228 lb (103.4 kg)  07/21/17 228 lb (103.4 kg)  03/19/17 240 lb (108.9 kg)    Physical Exam  Constitutional: She is oriented to person, place, and time. She appears well-developed.  HENT:  Head: Normocephalic and atraumatic.  Eyes: EOM are normal.  Neck: Normal range of motion. Neck supple. No tracheal deviation present. No thyromegaly present.  Cardiovascular: Normal rate and regular rhythm.   Pulmonary/Chest: Effort normal and breath sounds normal.  Abdominal: Soft. Bowel sounds are normal. There is no tenderness. There is no guarding.  Musculoskeletal: Normal range of motion. She exhibits no edema.  Neurological: She is alert and oriented to person, place, and time. She has normal reflexes. No cranial nerve deficit. Coordination normal.  Skin: Skin is warm and dry. No rash noted. No erythema. No pallor.  Psychiatric: She has a normal mood and affect. Judgment normal.    12/10/2013 A1c 9.1%.    Assessment & Plan:   1. Uncontrolled type 2 diabetes mellitus with complication, without long-term current use of insulin (HCC)  - Patient has currently uncontrolled symptomatic type 2 DM since  46 years of age. - She came with stable A1c of 7.6% , generally improving from from 9.1%. Recent labs reviewed.   Her diabetes is complicated by obesity/sedentary life and patient remains at a high risk for more acute and chronic complications of diabetes which include CAD, CVA, CKD, retinopathy, and neuropathy. These are all discussed in detail with the patient.  - I have counseled the patient on diet management and weight loss, by adopting a carbohydrate restricted/protein rich diet.  -  Suggestion is made for her to avoid simple carbohydrates  from her diet including Cakes, Sweet Desserts / Pastries, Ice Cream, Soda (diet and regular), Sweet Tea, Candies, Chips,  Cookies, Store Bought Juices, Alcohol in Excess of  1-2 drinks a day, Artificial Sweeteners, and "Sugar-free" Products. This will help patient to have stable blood glucose profile and potentially avoid unintended weight gain.   - I encouraged the patient to switch to  unprocessed or minimally processed complex starch and increased protein intake (animal or plant source), fruits, and vegetables.  - Patient is advised to stick to a routine mealtimes to eat 3 meals  a day and avoid unnecessary snacks ( to snack only to correct hypoglycemia).   - I have approached patient with the following individualized plan to manage diabetes and patient agrees:   - She is motivated, wants to minimize medications and willing to maximize lifestyle modification. - I will continue  metformin  1000 mg by mouth twice a day, therapeutically suitable for patient.  - I  will disontinue  Invokana . -I discussed and initiated Trulicity 0.75 mg weekly , with plan to advance if tolerated.  - Patient specific target  A1c;  LDL, HDL, Triglycerides, and  Waist Circumference were discussed in detail.  2) BP/HTN: Controlled.  Continue current medications including ACEI/ARB. 3) Lipids/HPL:   controled , LDL improving to 77 from 121. I advised her to continue atorvastatin 20 mg by mouth daily at bedtime. Side effects and precautions discussed  with her.  4)  Weight/Diet: CDE Consult has been initiated , exercise, and detailed carbohydrates information provided.  5) Chronic Care/Health Maintenance:  -Patient is on ACEI and encouraged to continue to follow up with Ophthalmology, Podiatrist at least yearly or according to recommendations, and advised to   stay away from smoking. I have recommended yearly flu vaccine and pneumonia vaccination at least every 5 years; moderate intensity exercise for up to 150 minutes weekly; and  sleep for at least 7 hours a day.  - Time spent with the patient: 25 min, of which >50% was spent in  reviewing her sugar logs , discussing her hypo- and hyper-glycemic episodes, reviewing her current and  previous labs and insulin doses and developing a plan to avoid hypo- and hyper-glycemia.    - I advised patient to maintain close follow up with for primary care needs.  Follow up plan: - Return in about 3 months (around 10/21/2017) for follow up with pre-visit labs.  Marquis Lunch, MD Phone: [phone]  Fax: [phone]  -  This note was partially dictated with voice recognition software. Similar sounding words can be transcribed inadequately or may not  be corrected upon review.  07/21/2017, 4:25 PM

## 2017-07-21 NOTE — Patient Instructions (Addendum)
Goals 1. Get back to going to gym 60-90 minutes 3 times per week 2. Lose 10 lbs next 2-3 months. 3. Try to avoid skipping meals 4. Get A1C to 7% or lower. Stop Invokana and start Trulicity.

## 2017-07-21 NOTE — Patient Instructions (Signed)

## 2017-09-25 ENCOUNTER — Other Ambulatory Visit: Payer: Self-pay | Admitting: "Endocrinology

## 2017-10-12 ENCOUNTER — Other Ambulatory Visit: Payer: Self-pay | Admitting: "Endocrinology

## 2017-10-14 LAB — BASIC METABOLIC PANEL
BUN: 20 (ref 4–21)
Creatinine: 0.6 (ref <=1.1)

## 2017-10-14 LAB — HEMOGLOBIN A1C: Hemoglobin A1C: 7.9

## 2017-10-20 ENCOUNTER — Other Ambulatory Visit: Payer: Self-pay | Admitting: "Endocrinology

## 2017-10-21 ENCOUNTER — Encounter: Payer: Self-pay | Admitting: "Endocrinology

## 2017-10-21 ENCOUNTER — Ambulatory Visit (INDEPENDENT_AMBULATORY_CARE_PROVIDER_SITE_OTHER): Payer: 59 | Admitting: "Endocrinology

## 2017-10-21 VITALS — BP 131/83 | HR 90 | Wt 234.0 lb

## 2017-10-21 DIAGNOSIS — I10 Essential (primary) hypertension: Secondary | ICD-10-CM | POA: Diagnosis not present

## 2017-10-21 DIAGNOSIS — E6609 Other obesity due to excess calories: Secondary | ICD-10-CM | POA: Diagnosis not present

## 2017-10-21 DIAGNOSIS — E782 Mixed hyperlipidemia: Secondary | ICD-10-CM | POA: Diagnosis not present

## 2017-10-21 DIAGNOSIS — E118 Type 2 diabetes mellitus with unspecified complications: Secondary | ICD-10-CM | POA: Diagnosis not present

## 2017-10-21 DIAGNOSIS — E1165 Type 2 diabetes mellitus with hyperglycemia: Secondary | ICD-10-CM

## 2017-10-21 DIAGNOSIS — IMO0002 Reserved for concepts with insufficient information to code with codable children: Secondary | ICD-10-CM

## 2017-10-21 MED ORDER — DULAGLUTIDE 1.5 MG/0.5ML ~~LOC~~ SOAJ: 1.5000 mg | SUBCUTANEOUS | 2 refills | Status: DC

## 2017-10-21 NOTE — Patient Instructions (Signed)

## 2017-10-21 NOTE — Progress Notes (Signed)
Subjective:    Patient ID: Stacy Clarke, female    DOB: 11/01/1970. Patient is being seen in f/u for management of diabetes requested by  Joaquin Courts, DO  Past Medical History:  Diagnosis Date  . HTN (hypertension) 08/28/2012    Social History   Socioeconomic History  . Marital status: Married    Spouse name: Not on file  . Number of children: Not on file  . Years of education: Not on file  . Highest education level: Not on file  Social Needs  . Financial resource strain: Not on file  . Food insecurity - worry: Not on file  . Food insecurity - inability: Not on file  . Transportation needs - medical: Not on file  . Transportation needs - non-medical: Not on file  Occupational History  . Not on file  Tobacco Use  . Smoking status: Former Games developer  . Smokeless tobacco: Never Used  Substance and Sexual Activity  . Alcohol use: No  . Drug use: No  . Sexual activity: Not on file  Other Topics Concern  . Not on file  Social History Narrative  . Not on file   Outpatient Encounter Medications as of 10/21/2017  Medication Sig  . atorvastatin (LIPITOR) 20 MG tablet TAKE 1 TABLET BY MOUTH EVERY DAY  . Dulaglutide (TRULICITY) 1.5 MG/0.5ML SOPN Inject 1.5 mg into the skin once a week.  Marland Kitchen lisinopril (PRINIVIL,ZESTRIL) 20 MG tablet Take 20 mg by mouth daily.  . metFORMIN (GLUCOPHAGE) 500 MG tablet Take 1,000 mg by mouth 2 (two) times daily after a meal.  . PARoxetine (PAXIL) 20 MG tablet Take 20 mg by mouth daily.  . ranitidine (ZANTAC) 150 MG tablet Take 150 mg by mouth at bedtime.  . [DISCONTINUED] TRULICITY 0.75 MG/0.5ML SOPN INJECT 0.75 MG INTO THE SKIN ONCE A WEEK.   No facility-administered encounter medications on file as of 10/21/2017.    ALLERGIES: No Known Allergies VACCINATION STATUS:  There is no immunization history on file for this patient.  Diabetes  She presents for her follow-up diabetic visit. She has type 2 diabetes mellitus. Onset time: She  was diagnosed at approximate age of 18 years after episodes of gestational diabetes with 3 of her pregnancies from 45 until 2014. Her disease course has been worsening. There are no hypoglycemic associated symptoms. Pertinent negatives for hypoglycemia include no confusion, headaches, pallor or seizures. Associated symptoms include fatigue. Pertinent negatives for diabetes include no chest pain, no polydipsia, no polyphagia and no polyuria. There are no hypoglycemic complications. Symptoms are worsening. There are no diabetic complications. Risk factors for coronary artery disease include diabetes mellitus, hypertension, obesity and sedentary lifestyle. Current diabetic treatment includes oral agent (dual therapy). Her weight is increasing steadily. She is following a diabetic and generally unhealthy diet. When asked about meal planning, she reported none. She has not had a previous visit with a dietitian. She never participates in exercise. An ACE inhibitor/angiotensin II receptor blocker is being taken. She does not see a podiatrist.Eye exam is not current.  Hypertension  This is a chronic problem. The current episode started more than 1 year ago. The problem is controlled. Pertinent negatives include no chest pain, headaches, palpitations or shortness of breath. Risk factors for coronary artery disease include diabetes mellitus, obesity and sedentary lifestyle. Past treatments include ACE inhibitors.    Review of Systems  Constitutional: Positive for fatigue. Negative for chills, fever and unexpected weight change.  HENT: Negative for trouble swallowing and  voice change.   Eyes: Negative for visual disturbance.  Respiratory: Negative for cough, shortness of breath and wheezing.   Cardiovascular: Negative for chest pain, palpitations and leg swelling.  Gastrointestinal: Negative for diarrhea, nausea and vomiting.  Endocrine: Negative for cold intolerance, heat intolerance, polydipsia, polyphagia and  polyuria.  Musculoskeletal: Negative for arthralgias and myalgias.  Skin: Negative for color change, pallor, rash and wound.  Neurological: Negative for seizures and headaches.  Psychiatric/Behavioral: Negative for confusion and suicidal ideas.    Objective:    BP 131/83   Pulse 90   Wt 234 lb (106.1 kg)   BMI 37.77 kg/m   Wt Readings from Last 3 Encounters:  10/21/17 234 lb (106.1 kg)  07/21/17 228 lb (103.4 kg)  07/21/17 228 lb (103.4 kg)    Physical Exam  Constitutional: She is oriented to person, place, and time. She appears well-developed.  HENT:  Head: Normocephalic and atraumatic.  Eyes: EOM are normal.  Neck: Normal range of motion. Neck supple. No tracheal deviation present. No thyromegaly present.  Cardiovascular: Normal rate and regular rhythm.  Pulmonary/Chest: Effort normal and breath sounds normal.  Abdominal: Soft. Bowel sounds are normal. There is no tenderness. There is no guarding.  Musculoskeletal: Normal range of motion. She exhibits no edema.  Neurological: She is alert and oriented to person, place, and time. She has normal reflexes. No cranial nerve deficit. Coordination normal.  Skin: Skin is warm and dry. No rash noted. No erythema. No pallor.  Psychiatric: She has a normal mood and affect. Judgment normal.    12/10/2013 A1c 9.1%.  Recent Results (from the past 2160 hour(s))  Basic metabolic panel     Status: None   Collection Time: 10/14/17 12:00 AM  Result Value Ref Range   BUN 20 4 - 21   Creatinine 0.6 0.5 - 1.1  Hemoglobin A1c     Status: None   Collection Time: 10/14/17 12:00 AM  Result Value Ref Range   Hemoglobin A1C 7.9      Assessment & Plan:   1. Uncontrolled type 2 diabetes mellitus with complication, without long-term current use of insulin (HCC)  - Patient has currently uncontrolled symptomatic type 2 DM since  47 years of age. - She came with higher A1c of 7.9%, after  generally improving from from 9.1%. Recent labs  reviewed.   Her diabetes is complicated by obesity/sedentary life and patient remains at a high risk for more acute and chronic complications of diabetes which include CAD, CVA, CKD, retinopathy, and neuropathy. These are all discussed in detail with the patient.  - I have counseled the patient on diet management and weight loss, by adopting a carbohydrate restricted/protein rich diet.  -  Suggestion is made for her to avoid simple carbohydrates  from her diet including Cakes, Sweet Desserts / Pastries, Ice Cream, Soda (diet and regular), Sweet Tea, Candies, Chips, Cookies, Store Bought Juices, Alcohol in Excess of  1-2 drinks a day, Artificial Sweeteners, and "Sugar-free" Products. This will help patient to have stable blood glucose profile and potentially avoid unintended weight gain.  - I encouraged the patient to switch to  unprocessed or minimally processed complex starch and increased protein intake (animal or plant source), fruits, and vegetables.  - Patient is advised to stick to a routine mealtimes to eat 3 meals  a day and avoid unnecessary snacks ( to snack only to correct hypoglycemia).   - I have approached patient with the following individualized plan to manage diabetes  and patient agrees:   - She is motivated, wants to minimize medications and willing to maximize lifestyle modification. - I will continue  metformin  1000 mg by mouth twice a day, therapeutically suitable for patient.  - I  will disontinue  Invokana . -I discussed and  increased her weekly  Trulicity to 1.5 mg weekly.  - Patient specific target  A1c;  LDL, HDL, Triglycerides, and  Waist Circumference were discussed in detail.  2) BP/HTN:   Her blood pressure is controlled to target. I advised her to continue her current blood pressure medications including ACEI/ARB. 3) Lipids/HPL:   controled , LDL improving to 77 from 121. I advised her to continue atorvastatin 20 mg by mouth daily at bedtime. Side effects and  precautions discussed with her.  4)  Weight/Diet: CDE Consult has been initiated , exercise, and detailed carbohydrates information provided.  5) Chronic Care/Health Maintenance:  -Patient is on ACEI and encouraged to continue to follow up with Ophthalmology, Podiatrist at least yearly or according to recommendations, and advised to   stay away from smoking. I have recommended yearly flu vaccine and pneumonia vaccination at least every 5 years; moderate intensity exercise for up to 150 minutes weekly; and  sleep for at least 7 hours a day.  - Time spent with the patient: 25 min, of which >50% was spent in reviewing her  current and  previous labs, previous treatments, and medications  doses and developing a plan for long-term care.   - I advised patient to maintain close follow up with for primary care needs.  Follow up plan: - Return in about 3 months (around 01/19/2018) for follow up with pre-visit labs.  Marquis Lunch, MD Phone: [phone]  Fax: [phone]  -  This note was partially dictated with voice recognition software. Similar sounding words can be transcribed inadequately or may not  be corrected upon review.  10/21/2017, 5:04 PM

## 2017-10-22 ENCOUNTER — Other Ambulatory Visit: Payer: Self-pay | Admitting: "Endocrinology

## 2017-10-22 MED ORDER — DULAGLUTIDE 1.5 MG/0.5ML ~~LOC~~ SOAJ: 1.5000 mg | SUBCUTANEOUS | 2 refills | Status: DC

## 2017-11-04 ENCOUNTER — Encounter: Payer: 59 | Attending: Family Medicine | Admitting: Nutrition

## 2017-11-04 ENCOUNTER — Other Ambulatory Visit: Payer: Self-pay | Admitting: "Endocrinology

## 2017-11-04 VITALS — Ht 66.0 in | Wt 234.0 lb

## 2017-11-04 DIAGNOSIS — E118 Type 2 diabetes mellitus with unspecified complications: Secondary | ICD-10-CM | POA: Insufficient documentation

## 2017-11-04 DIAGNOSIS — E1165 Type 2 diabetes mellitus with hyperglycemia: Secondary | ICD-10-CM

## 2017-11-04 DIAGNOSIS — IMO0002 Reserved for concepts with insufficient information to code with codable children: Secondary | ICD-10-CM

## 2017-11-04 DIAGNOSIS — Z713 Dietary counseling and surveillance: Secondary | ICD-10-CM | POA: Insufficient documentation

## 2017-11-04 DIAGNOSIS — E669 Obesity, unspecified: Secondary | ICD-10-CM

## 2017-11-04 NOTE — Patient Instructions (Addendum)
Goals1  Cut out bacon with breakfast Increase fresh fruits and vegetables NO snacks Don't skip meals Drink only water Exercise 60 minutes 3 times per week Lose 1-2 per week.

## 2017-11-04 NOTE — Progress Notes (Signed)
  Medical Nutrition Therapy:  Appt start time: 1600 end time: 1630  Assessment:  Primary concerns today: Type 2 DM. F/U Changes made: Hasn't made any changes. Larey SeatFell off wagon with the holidays. A1C up to 7.9%. Gained 6 lbs. Metformin 1000 mg BID and Trulicity 1.5 mg weekly. Her insurance doesn't cover it so after the 2 samples that were given today, she will stop Trulicity. Dr. Fransico HimNida approved giving her a month of samples.   Wt Readings from Last 3 Encounters:  11/04/17 234 lb (106.1 kg)  10/21/17 234 lb (106.1 kg)  07/21/17 228 lb (103.4 kg)   Ht Readings from Last 3 Encounters:  11/04/17 5\' 6"  (1.676 m)  07/21/17 5\' 6"  (1.676 m)  07/21/17 5\' 6"  (1.676 m)   Body mass index is 37.77 kg/m.  Lab Results  Component Value Date   HGBA1C 7.9 10/14/2017   Preferred Learning Style:  No preference indicated   Learning Readiness:   Ready  Change in progress   MEDICATIONS:    DIETARY INTAKE: 24-hr recall:  B ( AM): bacon 2 slices, blueberry bagel, strawbery cream cheese and egg. Snk ( AM):  L ( PM):  3 [place]key, cheese and peeled cucumber, water Snk ( PM):  D ( PM):  Skipped   Snk ( PM):  Beverages: water  Usual physical activity: Walking 30 minute a day  Estimated energy needs: 1800  calories 200 g carbohydrates 135 g protein 50 g fat  Progress Towards Goal(s):  In progress.   Nutritional Diagnosis:  NB-1.1 Food and nutrition-related knowledge deficit As related to Diabetes.  As evidenced by A1C 9.1%..    Intervention:  Nutrition and Diabetes education provided on My Plate, CHO counting, meal planning, portion sizes, timing of meals, avoiding snacks between meals unless having a low blood sugar, target ranges for A1C and blood sugars, signs/symptoms and treatment of hyper/hypoglycemia, monitoring blood sugars, taking medications as prescribed, benefits of exercising 30 minutes per day and prevention of complications of DM.  Goals1  Cut out bacon with  breakfast Increase fresh fruits and vegetables NO snacks Don't skip meals Drink only water Exercise 60 minutes 3 times per week Lose 1-2 per we .  Teaching Method Utilized:  Visual Auditory Hands on  Handouts given during visit include:  The Plate Method  Meal Plan Card  Diabetes Instructions.   Barriers to learning/adherence to lifestyle change: none  Demonstrated degree of understanding via:  Teach Back   Monitoring/Evaluation:  Dietary intake, exercise, meal planning, and body weight in 3 month(s).

## 2017-11-11 ENCOUNTER — Encounter: Payer: Self-pay | Admitting: Nutrition

## 2018-01-12 LAB — BASIC METABOLIC PANEL
BUN: 22 — AB (ref 4–21)
CREATININE: 0.8 (ref 0.5–1.1)

## 2018-01-12 LAB — HEMOGLOBIN A1C: Hemoglobin A1C: 6.8

## 2018-01-19 ENCOUNTER — Encounter: Payer: 59 | Attending: Family Medicine | Admitting: Nutrition

## 2018-01-19 ENCOUNTER — Encounter: Payer: Self-pay | Admitting: Nutrition

## 2018-01-19 ENCOUNTER — Ambulatory Visit (INDEPENDENT_AMBULATORY_CARE_PROVIDER_SITE_OTHER): Payer: 59 | Admitting: "Endocrinology

## 2018-01-19 ENCOUNTER — Encounter: Payer: Self-pay | Admitting: "Endocrinology

## 2018-01-19 VITALS — Ht 66.0 in | Wt 230.0 lb

## 2018-01-19 VITALS — BP 112/75 | HR 77 | Ht 66.0 in | Wt 230.0 lb

## 2018-01-19 DIAGNOSIS — E669 Obesity, unspecified: Secondary | ICD-10-CM

## 2018-01-19 DIAGNOSIS — Z6837 Body mass index (BMI) 37.0-37.9, adult: Secondary | ICD-10-CM | POA: Diagnosis not present

## 2018-01-19 DIAGNOSIS — I10 Essential (primary) hypertension: Secondary | ICD-10-CM | POA: Diagnosis not present

## 2018-01-19 DIAGNOSIS — E782 Mixed hyperlipidemia: Secondary | ICD-10-CM | POA: Diagnosis not present

## 2018-01-19 DIAGNOSIS — IMO0002 Reserved for concepts with insufficient information to code with codable children: Secondary | ICD-10-CM

## 2018-01-19 DIAGNOSIS — E1165 Type 2 diabetes mellitus with hyperglycemia: Secondary | ICD-10-CM

## 2018-01-19 DIAGNOSIS — Z713 Dietary counseling and surveillance: Secondary | ICD-10-CM | POA: Diagnosis present

## 2018-01-19 DIAGNOSIS — E118 Type 2 diabetes mellitus with unspecified complications: Secondary | ICD-10-CM

## 2018-01-19 NOTE — Progress Notes (Signed)
  Medical Nutrition Therapy:  Appt start time: 1130 end time: 1200 Assessment:  Primary concerns today: Type 2 DM. F/U   Lab Results  Component Value Date   HGBA1C 6.8 01/12/2018  Is taking Tulicity. Lost 4 lbs. Hasn't been able to get to the gym as much as she wanted. Ready to get back into exercise routine. Feels better. BS much better. Still admits to needing to watch portions and avoid empty calories.  Metformin 1000 mg BID and Trulicity 1.5 mg weekly.    Wt Readings from Last 3 Encounters:  01/19/18 230 lb (104.3 kg)  01/19/18 230 lb (104.3 kg)  11/04/17 234 lb (106.1 kg)   Ht Readings from Last 3 Encounters:  01/19/18 5\' 6"  (1.676 m)  01/19/18 5\' 6"  (1.676 m)  11/04/17 5\' 6"  (1.676 m)   Body mass index is 37.12 kg/m.  Lab Results  Component Value Date   HGBA1C 6.8 01/12/2018   Preferred Learning Style:  No preference indicated   Learning Readiness:   Ready  Change in progress   MEDICATIONS:    DIETARY INTAKE: 24-hr recall:  B ( AM): bacon 2 slices, blueberry bagel, strawbery cream cheese and egg. Snk ( AM):  L ( PM):  3 [place], cheese and peeled cucumber, water Snk ( PM):  D ( PM):  Skipped   Snk ( PM):  Beverages: water  Usual physical activity: Walking 30 minute a day  Estimated energy needs: 1800  calories 200 g carbohydrates 135 g protein 50 g fat  Progress Towards Goal(s):  In progress.   Nutritional Diagnosis:  NB-1.1 Food and nutrition-related knowledge deficit As related to Diabetes.  As evidenced by A1C 9.1%..    Intervention:  Nutrition and Diabetes education provided on My Plate, CHO counting, meal planning, portion sizes, timing of meals, avoiding snacks between meals unless having a low blood sugar, target ranges for A1C and blood sugars, signs/symptoms and treatment of hyper/hypoglycemia, monitoring blood sugars, taking medications as prescribed, benefits of exercising 30 minutes per day and prevention of complications of  DM.  Goals 1. Get back into exercise three a week for 60 minutes 2. Eat 30 - 45 grams per meals 3. Eat meals on time and avoid snacks. 4. Drink water bottles. 4-5 times per day.  Lose 1-2 lbs per week .  Teaching Method Utilized:  Visual Auditory Hands on  Handouts given during visit include:  The Plate Method  Meal Plan Card  Diabetes Instructions.   Barriers to learning/adherence to lifestyle change: none  Demonstrated degree of understanding via:  Teach Back   Monitoring/Evaluation:  Dietary intake, exercise, meal planning, and body weight in 3 month(s).

## 2018-01-19 NOTE — Patient Instructions (Signed)

## 2018-01-19 NOTE — Patient Instructions (Addendum)
Goals 1. Get back into exercise three a week for 60 minutes 2. Eat 30 - 45 grams per meals 3. Eat meals on time and avoid snacks. 4. Drink water bottles. 4-5 times per day.  Lose 1-2 lbs per week

## 2018-01-19 NOTE — Progress Notes (Signed)
Subjective:    Patient ID: Stacy Clarke, female    DOB: [birth year]. Patient is being seen in f/u for management of diabetes requested by  Joaquin Courts, DO  Past Medical History:  Diagnosis Date  . HTN (hypertension) 08/28/2012    Social History   Socioeconomic History  . Marital status: Married    Spouse name: Not on file  . Number of children: Not on file  . Years of education: Not on file  . Highest education level: Not on file  Occupational History  . Not on file  Social Needs  . Financial resource strain: Not on file  . Food insecurity:    Worry: Not on file    Inability: Not on file  . Transportation needs:    Medical: Not on file    Non-medical: Not on file  Tobacco Use  . Smoking status: Former Games developer  . Smokeless tobacco: Never Used  Substance and Sexual Activity  . Alcohol use: No  . Drug use: No  . Sexual activity: Not on file  Lifestyle  . Physical activity:    Days per week: Not on file    Minutes per session: Not on file  . Stress: Not on file  Relationships  . Social connections:    Talks on phone: Not on file    Gets together: Not on file    Attends religious service: Not on file    Active member of club or organization: Not on file    Attends meetings of clubs or organizations: Not on file    Relationship status: Not on file  Other Topics Concern  . Not on file  Social History Narrative  . Not on file   Outpatient Encounter Medications as of 01/19/2018  Medication Sig  . atorvastatin (LIPITOR) 20 MG tablet TAKE 1 TABLET BY MOUTH EVERY DAY  . Dulaglutide (TRULICITY) 1.5 MG/0.5ML SOPN Inject 1.5 mg into the skin once a week.  Marland Kitchen lisinopril (PRINIVIL,ZESTRIL) 20 MG tablet Take 20 mg by mouth daily.  . metFORMIN (GLUCOPHAGE) 500 MG tablet Take 1,000 mg by mouth 2 (two) times daily after a meal.  . PARoxetine (PAXIL) 20 MG tablet Take 20 mg by mouth daily.  . ranitidine (ZANTAC) 150 MG tablet Take 150 mg by mouth at bedtime.   No  facility-administered encounter medications on file as of 01/19/2018.    ALLERGIES: No Known Allergies VACCINATION STATUS:  There is no immunization history on file for this patient.  Diabetes  She presents for her follow-up diabetic visit. She has type 2 diabetes mellitus. Onset time: She was diagnosed at approximate age of 26 years after episodes of gestational diabetes with 3 of her pregnancies from 60 until 2014. Her disease course has been improving. There are no hypoglycemic associated symptoms. Pertinent negatives for hypoglycemia include no confusion, headaches, pallor or seizures. Associated symptoms include fatigue. Pertinent negatives for diabetes include no chest pain, no polydipsia, no polyphagia and no polyuria. There are no hypoglycemic complications. Symptoms are improving. There are no diabetic complications. Risk factors for coronary artery disease include diabetes mellitus, hypertension, obesity and sedentary lifestyle. Current diabetic treatment includes oral agent (dual therapy). Her weight is decreasing steadily. She is following a diabetic and generally unhealthy diet. When asked about meal planning, she reported none. She has not had a previous visit with a dietitian. She participates in exercise intermittently. An ACE inhibitor/angiotensin II receptor blocker is being taken. She does not see a podiatrist.Eye exam is not  current.  Hypertension  This is a chronic problem. The current episode started more than 1 year ago. The problem is controlled. Pertinent negatives include no chest pain, headaches, palpitations or shortness of breath. Risk factors for coronary artery disease include diabetes mellitus, obesity and sedentary lifestyle. Past treatments include ACE inhibitors.  Hyperlipidemia  This is a chronic problem. The current episode started more than 1 year ago. The problem is controlled. Exacerbating diseases include diabetes and obesity. Pertinent negatives include no  chest pain, myalgias or shortness of breath. Current antihyperlipidemic treatment includes statins. Risk factors for coronary artery disease include diabetes mellitus, dyslipidemia, hypertension, obesity and a sedentary lifestyle.    Review of Systems  Constitutional: Positive for fatigue. Negative for chills, fever and unexpected weight change.  HENT: Negative for trouble swallowing and voice change.   Eyes: Negative for visual disturbance.  Respiratory: Negative for cough, shortness of breath and wheezing.   Cardiovascular: Negative for chest pain, palpitations and leg swelling.  Gastrointestinal: Negative for diarrhea, nausea and vomiting.  Endocrine: Negative for cold intolerance, heat intolerance, polydipsia, polyphagia and polyuria.  Musculoskeletal: Negative for arthralgias and myalgias.  Skin: Negative for color change, pallor, rash and wound.  Neurological: Negative for seizures and headaches.  Psychiatric/Behavioral: Negative for confusion and suicidal ideas.    Objective:    BP 112/75   Pulse 77   Ht 5\' 6"  (1.676 m)   Wt 230 lb (104.3 kg)   BMI 37.12 kg/m   Wt Readings from Last 3 Encounters:  01/19/18 230 lb (104.3 kg)  11/04/17 234 lb (106.1 kg)  10/21/17 234 lb (106.1 kg)    Physical Exam  Constitutional: She is oriented to person, place, and time. She appears well-developed.  HENT:  Head: Normocephalic and atraumatic.  Eyes: EOM are normal.  Neck: Normal range of motion. Neck supple. No tracheal deviation present. No thyromegaly present.  Cardiovascular: Normal rate.  Pulmonary/Chest: Effort normal.  Abdominal: There is no tenderness. There is no guarding.  Musculoskeletal: Normal range of motion. She exhibits no edema.  Neurological: She is alert and oriented to person, place, and time. She has normal reflexes. No cranial nerve deficit. Coordination normal.  Skin: Skin is warm and dry. No rash noted. No erythema. No pallor.  Psychiatric: She has a normal mood  and affect. Judgment normal.   Recent Results (from the past 2160 hour(s))  Basic metabolic panel     Status: Abnormal   Collection Time: 01/12/18 12:00 AM  Result Value Ref Range   BUN 22 (A) 4 - 21   Creatinine 0.8 0.5 - 1.1  Hemoglobin A1c     Status: None   Collection Time: 01/12/18 12:00 AM  Result Value Ref Range   Hemoglobin A1C 6.8     January 12, 2018 A1c 6.8% October 14, 2017 A1c 7.9% 12/10/2013 A1c 9.1%.  Lipid Panel     Component Value Date/Time   CHOL 146 07/14/2017   TRIG 78 07/14/2017   HDL 53 07/14/2017   LDLCALC 77 07/14/2017    Assessment & Plan:   1. Uncontrolled type 2 diabetes mellitus with complication, without long-term current use of insulin (HCC)  - Patient has currently uncontrolled symptomatic type 2 DM since  47 years of age. - She came with improving A1c of 6.8%,  generally improving from from 9.1%. Recent labs reviewed.   Her diabetes is complicated by obesity/sedentary life and patient remains at a high risk for more acute and chronic complications of diabetes which include  CAD, CVA, CKD, retinopathy, and neuropathy. These are all discussed in detail with the patient.  - I have counseled the patient on diet management and weight loss, by adopting a carbohydrate restricted/protein rich diet.  -  Suggestion is made for her to avoid simple carbohydrates  from her diet including Cakes, Sweet Desserts / Pastries, Ice Cream, Soda (diet and regular), Sweet Tea, Candies, Chips, Cookies, Store Bought Juices, Alcohol in Excess of  1-2 drinks a day, Artificial Sweeteners, and "Sugar-free" Products. This will help patient to have stable blood glucose profile and potentially avoid unintended weight gain.   - I encouraged the patient to switch to  unprocessed or minimally processed complex starch and increased protein intake (animal or plant source), fruits, and vegetables.  - Patient is advised to stick to a routine mealtimes to eat 3 meals  a day and avoid  unnecessary snacks ( to snack only to correct hypoglycemia).   - I have approached patient with the following individualized plan to manage diabetes and patient agrees:   - She is motivated, wants to minimize medications and willing to maximize lifestyle modification. - I will continue  metformin  1000 mg by mouth twice a day, therapeutically suitable for patient. -I advised her to continue   Trulicity to 1.5 mg weekly or Ozempic 0.5 mg weekly (samples of both given to her).  - Patient specific target  A1c;  LDL, HDL, Triglycerides, and  Waist Circumference were discussed in detail.  2) BP/HTN:   Her blood pressure is controlled to target.   I advised her to continue her current blood pressure medications including ACEI/ARB. 3) Lipids/HPL: Her lipid panel from last visit was controlled with LDL of 77, improving from 161. I advised her to continue atorvastatin 20 mg by mouth daily at bedtime. Side effects and precautions discussed with her.  4)  Weight/Diet: CDE Consult has been initiated , exercise, and detailed carbohydrates information provided.  5) Chronic Care/Health Maintenance:  -Patient is on ACEI and encouraged to continue to follow up with Ophthalmology, Podiatrist at least yearly or according to recommendations, and advised to   stay away from smoking. I have recommended yearly flu vaccine and pneumonia vaccination at least every 5 years; moderate intensity exercise for up to 150 minutes weekly; and  sleep for at least 7 hours a day.  - I advised patient to maintain close follow up with for primary care needs.  - Time spent with the patient: 25 min, of which >50% was spent in reviewing her  current and  previous labs, previous treatments, and medications doses and developing a plan for long-term care.  Ryder Packman participated in the discussions, expressed understanding, and voiced agreement with the above plans.  All questions were answered to her satisfaction. she is encouraged  to contact clinic should she have any questions or concerns prior to her return visit.  Follow up plan: - Return in about 6 months (around 07/21/2018) for follow up with pre-visit labs.  Marquis Lunch, MD Phone: [phone]  Fax: [phone]  -  This note was partially dictated with voice recognition software. Similar sounding words can be transcribed inadequately or may not  be corrected upon review.  01/19/2018, 11:01 AM

## 2018-02-26 ENCOUNTER — Encounter: Payer: Self-pay | Admitting: "Endocrinology

## 2018-02-26 LAB — HM DIABETES EYE EXAM

## 2018-03-28 ENCOUNTER — Other Ambulatory Visit: Payer: Self-pay | Admitting: "Endocrinology

## 2018-04-22 ENCOUNTER — Other Ambulatory Visit: Payer: Self-pay

## 2018-04-22 MED ORDER — SEMAGLUTIDE(0.25 OR 0.5MG/DOS) 2 MG/1.5ML ~~LOC~~ SOPN: 0.5000 mg | PEN_INJECTOR | SUBCUTANEOUS | 2 refills | Status: DC

## 2018-07-10 ENCOUNTER — Other Ambulatory Visit: Payer: Self-pay | Admitting: "Endocrinology

## 2018-07-14 LAB — LIPID PANEL
CHOLESTEROL: 150 (ref 0–200)
HDL: 50 (ref 35–70)
LDL CALC: 83
TRIGLYCERIDES: 85 (ref 40–160)

## 2018-07-14 LAB — BASIC METABOLIC PANEL
BUN: 14 (ref 4–21)
Creatinine: 0.7 (ref 0.5–1.1)

## 2018-07-14 LAB — HEMOGLOBIN A1C: HEMOGLOBIN A1C: 7.3

## 2018-07-21 ENCOUNTER — Ambulatory Visit: Payer: 59 | Admitting: "Endocrinology

## 2018-07-21 ENCOUNTER — Ambulatory Visit: Payer: 59 | Admitting: Nutrition

## 2018-07-23 ENCOUNTER — Encounter: Payer: Self-pay | Admitting: "Endocrinology

## 2018-07-23 ENCOUNTER — Encounter: Payer: 59 | Attending: Family Medicine | Admitting: Nutrition

## 2018-07-23 ENCOUNTER — Ambulatory Visit (INDEPENDENT_AMBULATORY_CARE_PROVIDER_SITE_OTHER): Payer: 59 | Admitting: "Endocrinology

## 2018-07-23 ENCOUNTER — Encounter: Payer: Self-pay | Admitting: Nutrition

## 2018-07-23 VITALS — Wt 232.0 lb

## 2018-07-23 VITALS — BP 116/78 | HR 89 | Ht 66.0 in | Wt 232.0 lb

## 2018-07-23 DIAGNOSIS — Z713 Dietary counseling and surveillance: Secondary | ICD-10-CM | POA: Insufficient documentation

## 2018-07-23 DIAGNOSIS — E118 Type 2 diabetes mellitus with unspecified complications: Secondary | ICD-10-CM | POA: Diagnosis not present

## 2018-07-23 DIAGNOSIS — E6609 Other obesity due to excess calories: Secondary | ICD-10-CM | POA: Diagnosis not present

## 2018-07-23 DIAGNOSIS — E669 Obesity, unspecified: Secondary | ICD-10-CM

## 2018-07-23 DIAGNOSIS — E1165 Type 2 diabetes mellitus with hyperglycemia: Secondary | ICD-10-CM

## 2018-07-23 DIAGNOSIS — IMO0002 Reserved for concepts with insufficient information to code with codable children: Secondary | ICD-10-CM

## 2018-07-23 DIAGNOSIS — E782 Mixed hyperlipidemia: Secondary | ICD-10-CM

## 2018-07-23 DIAGNOSIS — I10 Essential (primary) hypertension: Secondary | ICD-10-CM

## 2018-07-23 MED ORDER — SEMAGLUTIDE (1 MG/DOSE) 2 MG/1.5ML ~~LOC~~ SOPN: 1.0000 mg | PEN_INJECTOR | SUBCUTANEOUS | 1 refills | Status: DC

## 2018-07-23 NOTE — Patient Instructions (Signed)

## 2018-07-23 NOTE — Patient Instructions (Signed)
Goals 1. Follow MY Plate 2. Cut out snacks 3. Increase fresh fruits and vegetables 4. Keep drinking water 5. Get Back to gym 3 times per week. Get A1C down to 6.5%

## 2018-07-23 NOTE — Progress Notes (Signed)
Endocrinology follow-up note   Subjective:    Patient ID: Stacy Clarke, female    DOB: 16-Aug-1971. Patient is being seen in f/u for management of type 2  Diabetes, HTN requested by  Joaquin Courts, DO  Past Medical History:  Diagnosis Date  . HTN (hypertension) 08/28/2012    Social History   Socioeconomic History  . Marital status: Married    Spouse name: Not on file  . Number of children: Not on file  . Years of education: Not on file  . Highest education level: Not on file  Occupational History  . Not on file  Social Needs  . Financial resource strain: Not on file  . Food insecurity:    Worry: Not on file    Inability: Not on file  . Transportation needs:    Medical: Not on file    Non-medical: Not on file  Tobacco Use  . Smoking status: Former Games developer  . Smokeless tobacco: Never Used  Substance and Sexual Activity  . Alcohol use: No  . Drug use: No  . Sexual activity: Not on file  Lifestyle  . Physical activity:    Days per week: Not on file    Minutes per session: Not on file  . Stress: Not on file  Relationships  . Social connections:    Talks on phone: Not on file    Gets together: Not on file    Attends religious service: Not on file    Active member of club or organization: Not on file    Attends meetings of clubs or organizations: Not on file    Relationship status: Not on file  Other Topics Concern  . Not on file  Social History Narrative  . Not on file   Outpatient Encounter Medications as of 07/23/2018  Medication Sig  . atorvastatin (LIPITOR) 20 MG tablet TAKE 1 TABLET BY MOUTH EVERY DAY  . lisinopril (PRINIVIL,ZESTRIL) 20 MG tablet Take 20 mg by mouth daily.  . metFORMIN (GLUCOPHAGE) 500 MG tablet Take 1,000 mg by mouth 2 (two) times daily after a meal.  . PARoxetine (PAXIL) 20 MG tablet Take 20 mg by mouth daily.  . ranitidine (ZANTAC) 150 MG tablet Take 150 mg by mouth at bedtime.  . Semaglutide, 1 MG/DOSE, (OZEMPIC, 1 MG/DOSE,)  2 MG/1.5ML SOPN Inject 1 mg into the skin once a week.  . [DISCONTINUED] Dulaglutide (TRULICITY) 1.5 MG/0.5ML SOPN Inject 1.5 mg into the skin once a week.  . [DISCONTINUED] OZEMPIC, 0.25 OR 0.5 MG/DOSE, 2 MG/1.5ML SOPN INJECT 0.5 MGS SUBCUTANEOUSLY ONCE A WEEK   No facility-administered encounter medications on file as of 07/23/2018.    ALLERGIES: No Known Allergies VACCINATION STATUS:  There is no immunization history on file for this patient.  Diabetes  She presents for her follow-up diabetic visit. She has type 2 diabetes mellitus. Onset time: She was diagnosed at approximate age of 13 years after episodes of gestational diabetes with 3 of her pregnancies from 67 until 2014. Her disease course has been improving. There are no hypoglycemic associated symptoms. Pertinent negatives for hypoglycemia include no confusion, headaches, pallor or seizures. Associated symptoms include fatigue. Pertinent negatives for diabetes include no chest pain, no polydipsia, no polyphagia and no polyuria. There are no hypoglycemic complications. Symptoms are improving. There are no diabetic complications. Risk factors for coronary artery disease include diabetes mellitus, hypertension, obesity and sedentary lifestyle. Current diabetic treatment includes oral agent (dual therapy). Her weight is decreasing steadily. She is following  a diabetic and generally unhealthy diet. When asked about meal planning, she reported none. She has not had a previous visit with a dietitian. She participates in exercise intermittently. An ACE inhibitor/angiotensin II receptor blocker is being taken. She does not see a podiatrist.Eye exam is not current.  Hypertension  This is a chronic problem. The current episode started more than 1 year ago. The problem is controlled. Pertinent negatives include no chest pain, headaches, palpitations or shortness of breath. Risk factors for coronary artery disease include diabetes mellitus, obesity and  sedentary lifestyle. Past treatments include ACE inhibitors.  Hyperlipidemia  This is a chronic problem. The current episode started more than 1 year ago. The problem is controlled. Exacerbating diseases include diabetes and obesity. Pertinent negatives include no chest pain, myalgias or shortness of breath. Current antihyperlipidemic treatment includes statins. Risk factors for coronary artery disease include diabetes mellitus, dyslipidemia, hypertension, obesity and a sedentary lifestyle.    Review of Systems  Constitutional: Positive for fatigue. Negative for chills, fever and unexpected weight change.  HENT: Negative for trouble swallowing and voice change.   Eyes: Negative for visual disturbance.  Respiratory: Negative for cough, shortness of breath and wheezing.   Cardiovascular: Negative for chest pain, palpitations and leg swelling.  Gastrointestinal: Negative for diarrhea, nausea and vomiting.  Endocrine: Negative for cold intolerance, heat intolerance, polydipsia, polyphagia and polyuria.  Musculoskeletal: Negative for arthralgias and myalgias.  Skin: Negative for color change, pallor, rash and wound.  Neurological: Negative for seizures and headaches.  Psychiatric/Behavioral: Negative for confusion and suicidal ideas.    Objective:    BP 116/78   Pulse 89   Ht 5\' 6"  (1.676 m)   Wt 232 lb (105.2 kg)   BMI 37.45 kg/m   Wt Readings from Last 3 Encounters:  07/23/18 232 lb (105.2 kg)  07/23/18 232 lb (105.2 kg)  01/19/18 230 lb (104.3 kg)    Physical Exam  Constitutional: She is oriented to person, place, and time. She appears well-developed.  HENT:  Head: Normocephalic and atraumatic.  Eyes: EOM are normal.  Neck: Normal range of motion. Neck supple. No tracheal deviation present. No thyromegaly present.  Cardiovascular: Normal rate.  Pulmonary/Chest: Effort normal.  Abdominal: There is no tenderness. There is no guarding.  Musculoskeletal: Normal range of motion. She  exhibits no edema.  Neurological: She is alert and oriented to person, place, and time. No cranial nerve deficit. Coordination normal.  Skin: Skin is warm and dry. No rash noted. No erythema. No pallor.  Psychiatric: She has a normal mood and affect. Judgment normal.   Recent Results (from the past 2160 hour(s))  Basic metabolic panel     Status: None   Collection Time: 07/14/18 12:00 AM  Result Value Ref Range   BUN 14 4 - 21   Creatinine 0.7 0.5 - 1.1  Lipid panel     Status: None   Collection Time: 07/14/18 12:00 AM  Result Value Ref Range   Triglycerides 85 40 - 160   Cholesterol 150 0 - 200   HDL 50 35 - 70   LDL Cholesterol 83   Hemoglobin A1c     Status: None   Collection Time: 07/14/18 12:00 AM  Result Value Ref Range   Hemoglobin A1C 7.3   Lipid Panel     Component Value Date/Time   CHOL 150 07/14/2018   TRIG 85 07/14/2018   HDL 50 07/14/2018   LDLCALC 83 07/14/2018    January 12, 2018 A1c 6.8%  October 14, 2017 A1c 7.9% 12/10/2013 A1c 9.1%.  Assessment & Plan:   1. Uncontrolled type 2 diabetes mellitus with complication, without long-term current use of insulin (HCC)  - Patient has currently uncontrolled symptomatic type 2 DM since  47 years of age. - She came with stable A1c of 7.3%, increasing from 6.8%.  She has generally improved her A1c from 9.1%.  - Her recent labs reviewed with her.   Her diabetes is complicated by obesity/sedentary life and patient remains at a high risk for more acute and chronic complications of diabetes which include CAD, CVA, CKD, retinopathy, and neuropathy. These are all discussed in detail with the patient.  - I have counseled the patient on diet management and weight loss, by adopting a carbohydrate restricted/protein rich diet.  -  Suggestion is made for her to avoid simple carbohydrates  from her diet including Cakes, Sweet Desserts / Pastries, Ice Cream, Soda (diet and regular), Sweet Tea, Candies, Chips, Cookies, Store Bought  Juices, Alcohol in Excess of  1-2 drinks a day, Artificial Sweeteners, and "Sugar-free" Products. This will help patient to have stable blood glucose profile and potentially avoid unintended weight gain.   - I encouraged the patient to switch to  unprocessed or minimally processed complex starch and increased protein intake (animal or plant source), fruits, and vegetables.  - Patient is advised to stick to a routine mealtimes to eat 3 meals  a day and avoid unnecessary snacks ( to snack only to correct hypoglycemia).   - I have approached patient with the following individualized plan to manage diabetes and patient agrees:   - She is motivated, wants to minimize medications and willing to maximize lifestyle modification. -She is advised to continue metformin 1000 mg p.o. twice daily-daily after breakfast and supper,  therapeutically suitable for patient. -I discussed and increased her Ozempic to 1 mg subcutaneously weekly.    - Patient specific target  A1c;  LDL, HDL, Triglycerides, and  Waist Circumference were discussed in detail.  2) BP/HTN:   Her blood pressure is controlled to target.  She is advised to continue her current blood pressure medications including ACEI/ARB. 3) Lipids/HPL: Her lipid panel from last visit was controlled with LDL of 83, improving from 191.  She is advised to continue  atorvastatin 20 mg by mouth daily at bedtime. Side effects and precautions discussed with her.  4)  Weight/Diet: CDE Consult in progress  , exercise, and detailed carbohydrates information provided.  5) Chronic Care/Health Maintenance:  -Patient is on ACEI and encouraged to continue to follow up with Ophthalmology, Podiatrist at least yearly or according to recommendations, and advised to   stay away from smoking. I have recommended yearly flu vaccine and pneumonia vaccination at least every 5 years; moderate intensity exercise for up to 150 minutes weekly; and  sleep for at least 7 hours a  day.  - I advised patient to maintain close follow up with for primary care needs.  - Time spent with the patient: 25 min, of which >50% was spent in reviewing her  current and  previous labs, previous treatments, and medications doses and developing a plan for long-term care.  Stacy Clarke participated in the discussions, expressed understanding, and voiced agreement with the above plans.  All questions were answered to her satisfaction. she is encouraged to contact clinic should she have any questions or concerns prior to her return visit.   Follow up plan: - Return in about 6 months (around 01/22/2019) for  Follow up with Pre-visit Labs.  Marquis Lunch, MD Phone: [phone]  Fax: [phone]  -  This note was partially dictated with voice recognition software. Similar sounding words can be transcribed inadequately or may not  be corrected upon review.  07/23/2018, 12:19 PM

## 2018-07-23 NOTE — Progress Notes (Signed)
  Medical Nutrition Therapy:  Appt start time: 1130 end time: 1200 Assessment:  Primary concerns today: Type 2 DM. F/U  . A1C went up to 7.3%. Her husband and son were in a serious car wreck and has been under a lot of stress. She is in the process of getting back on track with meal planning, exercising. Engaged to eating better and getting A1C back down and lose weight. Lab Results  Component Value Date   HGBA1C 6.8 01/12/2018    Metformin 1000 mg BID and Trulicity 1.5 mg weekly.    Wt Readings from Last 3 Encounters:  07/23/18 232 lb (105.2 kg)  01/19/18 230 lb (104.3 kg)  01/19/18 230 lb (104.3 kg)   Ht Readings from Last 3 Encounters:  07/23/18 5\' 6"  (1.676 m)  01/19/18 5\' 6"  (1.676 m)  01/19/18 5\' 6"  (1.676 m)   There is no height or weight on file to calculate BMI.  Lab Results  Component Value Date   HGBA1C 6.8 01/12/2018   Preferred Learning Style:  No preference indicated   Learning Readiness:   Ready  Change in progress   MEDICATIONS:    DIETARY INTAKE: 24-hr recall:  B ( AM): [place] bagel, hard boiled egg, bacon, Snk ( AM):  [place] yogurt L ( PM): [place], cheese, water Snk ( PM):  D ( PM):  Leftovers from lunch.  Snk ( PM):  Beverages: water  Usual physical activity: Walking 30 minute a day  Estimated energy needs: 1800  calories 200 g carbohydrates 135 g protein 50 g fat  Progress Towards Goal(s):  In progress.   Nutritional Diagnosis:  NB-1.1 Food and nutrition-related knowledge deficit As related to Diabetes.  As evidenced by A1C 9.1%..    Intervention:  Nutrition and Diabetes education provided on My Plate, CHO counting, meal planning, portion sizes, timing of meals, avoiding snacks between meals unless having a low blood sugar, target ranges for A1C and blood sugars, signs/symptoms and treatment of hyper/hypoglycemia, monitoring blood sugars, taking medications as prescribed, benefits of exercising 30 minutes per day and prevention of  complications of DM.  Goals 1. Follow MY Plate 2. Cut out snacks 3. Increase fresh fruits and vegetables 4. Keep drinking water 5. Get Back to gym 3 times per week. Get A1C down to 6.5%  Teaching Method Utilized:  Visual Auditory Hands on  Handouts given during visit include:  The Plate Method  Meal Plan Card  Diabetes Instructions.   Barriers to learning/adherence to lifestyle change: none  Demonstrated degree of understanding via:  Teach Back   Monitoring/Evaluation:  Dietary intake, exercise, meal planning, and body weight in 3 month(s).

## 2018-10-07 ENCOUNTER — Other Ambulatory Visit: Payer: Self-pay | Admitting: "Endocrinology

## 2018-12-19 ENCOUNTER — Other Ambulatory Visit: Payer: Self-pay | Admitting: "Endocrinology

## 2019-01-20 LAB — HEMOGLOBIN A1C: Hemoglobin A1C: 8.2

## 2019-01-25 ENCOUNTER — Encounter: Payer: Self-pay | Admitting: "Endocrinology

## 2019-01-25 ENCOUNTER — Other Ambulatory Visit: Payer: Self-pay

## 2019-01-25 ENCOUNTER — Ambulatory Visit (INDEPENDENT_AMBULATORY_CARE_PROVIDER_SITE_OTHER): Payer: 59 | Admitting: "Endocrinology

## 2019-01-25 ENCOUNTER — Ambulatory Visit: Payer: 59 | Admitting: Nutrition

## 2019-01-25 ENCOUNTER — Other Ambulatory Visit: Payer: Self-pay | Admitting: "Endocrinology

## 2019-01-25 DIAGNOSIS — IMO0002 Reserved for concepts with insufficient information to code with codable children: Secondary | ICD-10-CM

## 2019-01-25 DIAGNOSIS — E782 Mixed hyperlipidemia: Secondary | ICD-10-CM

## 2019-01-25 DIAGNOSIS — E118 Type 2 diabetes mellitus with unspecified complications: Secondary | ICD-10-CM

## 2019-01-25 DIAGNOSIS — E1165 Type 2 diabetes mellitus with hyperglycemia: Secondary | ICD-10-CM | POA: Diagnosis not present

## 2019-01-25 DIAGNOSIS — I10 Essential (primary) hypertension: Secondary | ICD-10-CM

## 2019-01-25 MED ORDER — GLUCOSE BLOOD VI STRP: ORAL_STRIP | 3 refills | Status: DC

## 2019-01-25 MED ORDER — GLIPIZIDE ER 5 MG PO TB24: 5.0000 mg | ORAL_TABLET | Freq: Every day | ORAL | 2 refills | Status: DC

## 2019-01-25 NOTE — Progress Notes (Signed)
Endocrinology Telehealth Visit Follow up Note -During COVID -19 Pandemic  This visit type was conducted due to national recommendations for restrictions regarding the COVID-19 Pandemic  in an effort to limit this patient's exposure and mitigate transmission of the corona virus.  Due to his co-morbid illnesses, Stacy Clarke is at  moderate to high risk for complications without adequate follow up.  This format is felt to be most appropriate for her at this time.  I connected with this patient on 01/25/2019   by telephone and verified that I am speaking with the correct person using two identifiers. Jennamarie Nettleton, 1971-02-14. she has verbally consented to this visit. All issues noted in this document were discussed and addressed. The format was not optimal for physical exam.   Subjective:    Patient ID: Stacy Clarke, female    DOB: 1971-02-14. Patient is being engaged in telehealth for management of type 2  Diabetes, HTN requested by  Joaquin CourtsFavero, John Patrick, DO  Past Medical History:  Diagnosis Date  . HTN (hypertension) 08/28/2012    Social History   Socioeconomic History  . Marital status: Married    Spouse name: Not on file  . Number of children: Not on file  . Years of education: Not on file  . Highest education level: Not on file  Occupational History  . Not on file  Social Needs  . Financial resource strain: Not on file  . Food insecurity:    Worry: Not on file    Inability: Not on file  . Transportation needs:    Medical: Not on file    Non-medical: Not on file  Tobacco Use  . Smoking status: Former Games developermoker  . Smokeless tobacco: Never Used  Substance and Sexual Activity  . Alcohol use: No  . Drug use: No  . Sexual activity: Not on file  Lifestyle  . Physical activity:    Days per week: Not on file    Minutes per session: Not on file  . Stress: Not on file  Relationships  . Social connections:    Talks on  phone: Not on file    Gets together: Not on file    Attends religious service: Not on file    Active member of club or organization: Not on file    Attends meetings of clubs or organizations: Not on file    Relationship status: Not on file  Other Topics Concern  . Not on file  Social History Narrative  . Not on file   Outpatient Encounter Medications as of 01/25/2019  Medication Sig  . atorvastatin (LIPITOR) 20 MG tablet TAKE 1 TABLET BY MOUTH EVERY DAY  . glipiZIDE (GLUCOTROL XL) 5 MG 24 hr tablet Take 1 tablet (5 mg total) by mouth daily with breakfast.  . glucose blood (ONE TOUCH ULTRA TEST) test strip Use as instructed  . lisinopril (PRINIVIL,ZESTRIL) 20 MG tablet Take 20 mg by mouth daily.  . metFORMIN (GLUCOPHAGE) 500 MG tablet Take 1,000 mg by mouth 2 (two) times daily after a meal.  . OZEMPIC, 1 MG/DOSE, 2 MG/1.5ML SOPN INJECT 1 MG INTO THE SKIN ONCE A WEEK.  Marland Kitchen. PARoxetine (PAXIL) 20 MG tablet Take 20 mg  by mouth daily.  . ranitidine (ZANTAC) 150 MG tablet Take 150 mg by mouth at bedtime.   No facility-administered encounter medications on file as of 01/25/2019.    ALLERGIES: No Known Allergies VACCINATION STATUS:  There is no immunization history on file for this patient.  Diabetes  She presents for her follow-up diabetic visit. She has type 2 diabetes mellitus. Onset time: She was diagnosed at approximate age of 82 years after episodes of gestational diabetes with 3 of her pregnancies from 89 until 2014. Her disease course has been worsening. There are no hypoglycemic associated symptoms. Pertinent negatives for hypoglycemia include no confusion, headaches, pallor or seizures. Associated symptoms include fatigue. Pertinent negatives for diabetes include no chest pain, no polydipsia, no polyphagia and no polyuria. There are no hypoglycemic complications. Symptoms are worsening. There are no diabetic complications. Risk factors for coronary artery disease include diabetes  mellitus, hypertension, obesity and sedentary lifestyle. Current diabetic treatment includes oral agent (dual therapy). Her weight is increasing steadily. She is following a diabetic and generally unhealthy diet. When asked about meal planning, she reported none. She has not had a previous visit with a dietitian. She participates in exercise intermittently. Her home blood glucose trend is increasing steadily. An ACE inhibitor/angiotensin II receptor blocker is being taken. She does not see a podiatrist.Eye exam is not current.  Hypertension  This is a chronic problem. The current episode started more than 1 year ago. The problem is controlled. Pertinent negatives include no chest pain, headaches, palpitations or shortness of breath. Risk factors for coronary artery disease include diabetes mellitus, obesity and sedentary lifestyle. Past treatments include ACE inhibitors.  Hyperlipidemia  This is a chronic problem. The current episode started more than 1 year ago. The problem is controlled. Exacerbating diseases include diabetes and obesity. Pertinent negatives include no chest pain, myalgias or shortness of breath. Current antihyperlipidemic treatment includes statins. Risk factors for coronary artery disease include diabetes mellitus, dyslipidemia, hypertension, obesity and a sedentary lifestyle.    Review of Systems  Constitutional: Positive for fatigue. Negative for chills, fever and unexpected weight change.  HENT: Negative for trouble swallowing and voice change.   Eyes: Negative for visual disturbance.  Respiratory: Negative for cough, shortness of breath and wheezing.   Cardiovascular: Negative for chest pain, palpitations and leg swelling.  Gastrointestinal: Negative for diarrhea, nausea and vomiting.  Endocrine: Negative for cold intolerance, heat intolerance, polydipsia, polyphagia and polyuria.  Musculoskeletal: Negative for arthralgias and myalgias.  Skin: Negative for color change,  pallor, rash and wound.  Neurological: Negative for seizures and headaches.  Psychiatric/Behavioral: Negative for confusion and suicidal ideas.    Objective:    There were no vitals taken for this visit.  Wt Readings from Last 3 Encounters:  07/23/18 232 lb (105.2 kg)  07/23/18 232 lb (105.2 kg)  01/19/18 230 lb (104.3 kg)    Physical Exam  Constitutional: She is oriented to person, place, and time. She appears well-developed.  HENT:  Head: Normocephalic and atraumatic.  Eyes: EOM are normal.  Neck: Normal range of motion. Neck supple. No tracheal deviation present. No thyromegaly present.  Cardiovascular: Normal rate.  Pulmonary/Chest: Effort normal.  Abdominal: There is no abdominal tenderness. There is no guarding.  Musculoskeletal: Normal range of motion.        General: No edema.  Neurological: She is alert and oriented to person, place, and time. No cranial nerve deficit. Coordination normal.  Skin: Skin is warm and dry. No rash noted. No erythema.  No pallor.  Psychiatric: She has a normal mood and affect. Judgment normal.   Recent Results (from the past 2160 hour(s))  Hemoglobin A1c     Status: None   Collection Time: 01/20/19 12:00 AM  Result Value Ref Range   Hemoglobin A1C 8.2   Lipid Panel     Component Value Date/Time   CHOL 150 07/14/2018   TRIG 85 07/14/2018   HDL 50 07/14/2018   LDLCALC 83 07/14/2018    January 12, 2018 A1c 6.8% October 14, 2017 A1c 7.9% 12/10/2013 A1c 9.1%.  Assessment & Plan:   1. Uncontrolled type 2 diabetes mellitus with complication, without long-term current use of insulin (HCC)  - Patient has currently uncontrolled symptomatic type 2 DM since  48 years of age. -Her previsit labs show A1c of 8.2% increasing from 7.3%.  Her recent labs are reviewed with her.   Her diabetes is complicated by obesity/sedentary life and patient remains at a high risk for more acute and chronic complications of diabetes which include CAD, CVA, CKD,  retinopathy, and neuropathy. These are all discussed in detail with the patient.  - I have counseled the patient on diet management and weight loss, by adopting a carbohydrate restricted/protein rich diet.  - Patient admits there is a room for improvement in her diet and drink choices. -  Suggestion is made for her to avoid simple carbohydrates  from her diet including Cakes, Sweet Desserts / Pastries, Ice Cream, Soda (diet and regular), Sweet Tea, Candies, Chips, Cookies, Store Bought Juices, Alcohol in Excess of  1-2 drinks a day, Artificial Sweeteners, and "Sugar-free" Products. This will help patient to have stable blood glucose profile and potentially avoid unintended weight gain. - I encouraged the patient to switch to  unprocessed or minimally processed complex starch and increased protein intake (animal or plant source), fruits, and vegetables.  - Patient is advised to stick to a routine mealtimes to eat 3 meals  a day and avoid unnecessary snacks ( to snack only to correct hypoglycemia).   - I have approached patient with the following individualized plan to manage diabetes and patient agrees:   - She is motivated, wants to minimize medications and willing to maximize lifestyle modification.  however, she will need additional medication.  I discussed and added glipizide 5 mg XL p.o. daily at breakfast along with her metformin 1000 mg p.o. twice daily at breakfast and supper.  She is also advised to continue Ozempic 1 mg subcutaneously weekly.   2) BP/HTN:  she is advised to home monitor blood pressure and report if > 140/90 on 2 separate readings. She is advised to continue her current blood pressure medications including ACEI/ARB. 3) Lipids/HPL: Her lipid panel from last visit was controlled with LDL of 83, improving from 914.  She is advised to continue  atorvastatin 20 mg by mouth daily at bedtime. Side effects and precautions discussed with her.  4)  Weight/Diet: CDE Consult in  progress  , exercise, and detailed carbohydrates information provided.  5) Chronic Care/Health Maintenance:  -Patient is on ACEI and encouraged to continue to follow up with Ophthalmology, Podiatrist at least yearly or according to recommendations, and advised to   stay away from smoking. I have recommended yearly flu vaccine and pneumonia vaccination at least every 5 years; moderate intensity exercise for up to 150 minutes weekly; and  sleep for at least 7 hours a day.  - I advised patient to maintain close follow up with for primary care  needs.  - Patient Care Time Today:  25 min, of which >50% was spent in reviewing her  current and  previous labs/studies, previous treatments, and medications doses and developing a plan for long-term care based on the latest recommendations for standards of care.  Torsha Ho participated in the discussions, expressed understanding, and voiced agreement with the above plans.  All questions were answered to her satisfaction. she is encouraged to contact clinic should she have any questions or concerns prior to her return visit.   Follow up plan: - Return in about 4 months (around 05/27/2019) for Meter, and Logs.  Marquis Lunch, MD Phone: [phone]  Fax: [phone]  -  This note was partially dictated with voice recognition software. Similar sounding words can be transcribed inadequately or may not  be corrected upon review.  01/25/2019, 5:42 PM

## 2019-04-09 ENCOUNTER — Other Ambulatory Visit: Payer: Self-pay | Admitting: "Endocrinology

## 2019-04-14 ENCOUNTER — Telehealth: Payer: Self-pay | Admitting: "Endocrinology

## 2019-04-14 ENCOUNTER — Telehealth: Payer: Self-pay

## 2019-04-14 DIAGNOSIS — E1165 Type 2 diabetes mellitus with hyperglycemia: Secondary | ICD-10-CM

## 2019-04-14 DIAGNOSIS — IMO0002 Reserved for concepts with insufficient information to code with codable children: Secondary | ICD-10-CM

## 2019-04-14 MED ORDER — OZEMPIC (1 MG/DOSE) 2 MG/1.5ML ~~LOC~~ SOPN: 1.0000 mg | PEN_INJECTOR | Freq: Once | SUBCUTANEOUS | 0 refills | Status: AC

## 2019-04-14 NOTE — Telephone Encounter (Signed)
PATIENT LEFT A VM THAT SHE NEEDS A REFILL ON OZEMPIC, 1 MG/DOSE, 2 MG/1.5ML SOPN. DR NIDA WAS GIVING HER SAMPLES. SHE NOW NEEDS A SCRIPT. [facility]

## 2019-04-14 NOTE — Telephone Encounter (Signed)
Done

## 2019-04-14 NOTE — Telephone Encounter (Signed)
LeighAnn Derron Pipkins, CMA  

## 2019-04-19 ENCOUNTER — Other Ambulatory Visit: Payer: Self-pay

## 2019-04-19 MED ORDER — SEMAGLUTIDE (1 MG/DOSE) 2 MG/1.5ML ~~LOC~~ SOPN: 1.0000 mg | PEN_INJECTOR | SUBCUTANEOUS | 0 refills | Status: DC

## 2019-04-19 NOTE — Telephone Encounter (Signed)
Patient needs refill on ozempic

## 2019-04-19 NOTE — Telephone Encounter (Signed)
Rx sent 

## 2019-04-21 ENCOUNTER — Other Ambulatory Visit: Payer: Self-pay | Admitting: "Endocrinology

## 2019-05-27 ENCOUNTER — Ambulatory Visit: Payer: 59 | Admitting: "Endocrinology

## 2019-06-21 LAB — HEMOGLOBIN A1C: Hemoglobin A1C: 7.1

## 2019-06-21 LAB — BASIC METABOLIC PANEL
BUN: 28 — AB (ref 4–21)
Creatinine: 0.6 (ref 0.5–1.1)

## 2019-06-25 ENCOUNTER — Other Ambulatory Visit: Payer: Self-pay

## 2019-06-25 ENCOUNTER — Encounter: Payer: Self-pay | Admitting: "Endocrinology

## 2019-06-25 ENCOUNTER — Ambulatory Visit (INDEPENDENT_AMBULATORY_CARE_PROVIDER_SITE_OTHER): Payer: 59 | Admitting: "Endocrinology

## 2019-06-25 DIAGNOSIS — E118 Type 2 diabetes mellitus with unspecified complications: Secondary | ICD-10-CM

## 2019-06-25 DIAGNOSIS — I10 Essential (primary) hypertension: Secondary | ICD-10-CM

## 2019-06-25 DIAGNOSIS — E1165 Type 2 diabetes mellitus with hyperglycemia: Secondary | ICD-10-CM | POA: Diagnosis not present

## 2019-06-25 DIAGNOSIS — IMO0002 Reserved for concepts with insufficient information to code with codable children: Secondary | ICD-10-CM

## 2019-06-25 DIAGNOSIS — E782 Mixed hyperlipidemia: Secondary | ICD-10-CM | POA: Diagnosis not present

## 2019-06-25 MED ORDER — SEMAGLUTIDE (1 MG/DOSE) 2 MG/1.5ML ~~LOC~~ SOPN: 1.0000 mg | PEN_INJECTOR | SUBCUTANEOUS | 1 refills | Status: DC

## 2019-06-25 MED ORDER — ATORVASTATIN CALCIUM 20 MG PO TABS: 20.0000 mg | ORAL_TABLET | Freq: Every day | ORAL | 1 refills | Status: DC

## 2019-06-25 MED ORDER — METFORMIN HCL 500 MG PO TABS: 1000.0000 mg | ORAL_TABLET | Freq: Two times a day (BID) | ORAL | 1 refills | Status: DC

## 2019-06-25 MED ORDER — ONETOUCH VERIO VI STRP: ORAL_STRIP | 2 refills | Status: DC

## 2019-06-25 MED ORDER — GLIPIZIDE ER 5 MG PO TB24: ORAL_TABLET | ORAL | 1 refills | Status: DC

## 2019-06-25 NOTE — Progress Notes (Signed)
Endocrinology Telehealth Visit Follow up Note -During COVID -19 Pandemic  This visit type was conducted due to national recommendations for restrictions regarding the COVID-19 Pandemic  in an effort to limit this patient's exposure and mitigate transmission of the corona virus.  Due to his co-morbid illnesses, Stacy Clarke is at  moderate to high risk for complications without adequate follow up.  This format is felt to be most appropriate for her at this time.  I connected with this patient on 06/25/2019   by telephone and verified that I am speaking with the correct person using two identifiers. Stacy Clarke, [birth year]. she has verbally consented to this visit. All issues noted in this document were discussed and addressed. The format was not optimal for physical exam.   Subjective:    Patient ID: Stacy CarwinRenee Benthall, female    DOB: [birth year]. Patient is being engaged in telehealth via telephone  for management of type 2  Diabetes, HTN requested by  Joaquin CourtsFavero, John Patrick, DO  Past Medical History:  Diagnosis Date  . HTN (hypertension) 08/28/2012    Social History   Socioeconomic History  . Marital status: Married    Spouse name: Not on file  . Number of children: Not on file  . Years of education: Not on file  . Highest education level: Not on file  Occupational History  . Not on file  Social Needs  . Financial resource strain: Not on file  . Food insecurity    Worry: Not on file    Inability: Not on file  . Transportation needs    Medical: Not on file    Non-medical: Not on file  Tobacco Use  . Smoking status: Former Games developermoker  . Smokeless tobacco: Never Used  Substance and Sexual Activity  . Alcohol use: No  . Drug use: No  . Sexual activity: Not on file  Lifestyle  . Physical activity    Days per week: Not on file    Minutes per session: Not on file  . Stress: Not on file  Relationships  . Social Wellsite geologistconnections     Talks on phone: Not on file    Gets together: Not on file    Attends religious service: Not on file    Active member of club or organization: Not on file    Attends meetings of clubs or organizations: Not on file    Relationship status: Not on file  Other Topics Concern  . Not on file  Social History Narrative  . Not on file   Outpatient Encounter Medications as of 06/25/2019  Medication Sig  . atorvastatin (LIPITOR) 20 MG tablet Take 1 tablet (20 mg total) by mouth daily.  Marland Kitchen. glipiZIDE (GLUCOTROL XL) 5 MG 24 hr tablet TAKE 1 TABLET BY MOUTH EVERY DAY WITH BREAKFAST  . glucose blood (ONETOUCH VERIO) test strip Use as instructed  . lisinopril (PRINIVIL,ZESTRIL) 20 MG tablet Take 20 mg by mouth daily.  . metFORMIN (GLUCOPHAGE) 500 MG tablet Take 2 tablets (1,000 mg total) by mouth 2 (two) times daily after a meal.  . PARoxetine (PAXIL) 20 MG tablet Take 20 mg by mouth daily.  . ranitidine (ZANTAC) 150 MG tablet Take 150 mg  by mouth at bedtime.  . Semaglutide, 1 MG/DOSE, 2 MG/1.5ML SOPN Inject 1 mg into the skin once a week.  . [DISCONTINUED] atorvastatin (LIPITOR) 20 MG tablet TAKE 1 TABLET BY MOUTH EVERY DAY  . [DISCONTINUED] glipiZIDE (GLUCOTROL XL) 5 MG 24 hr tablet TAKE 1 TABLET BY MOUTH EVERY DAY WITH BREAKFAST  . [DISCONTINUED] glucose blood (ONE TOUCH ULTRA TEST) test strip Use as instructed  . [DISCONTINUED] metFORMIN (GLUCOPHAGE) 500 MG tablet Take 1,000 mg by mouth 2 (two) times daily after a meal.  . [DISCONTINUED] Semaglutide, 1 MG/DOSE, 2 MG/1.5ML SOPN Inject 1 mg into the skin once a week.   No facility-administered encounter medications on file as of 06/25/2019.    ALLERGIES: No Known Allergies VACCINATION STATUS:  There is no immunization history on file for this patient.  Diabetes She presents for her follow-up diabetic visit. She has type 2 diabetes mellitus. Onset time: She was diagnosed at approximate age of 53 years after episodes of gestational diabetes with 3 of  her pregnancies from 83 until 2014. Her disease course has been improving. There are no hypoglycemic associated symptoms. Pertinent negatives for hypoglycemia include no confusion, headaches, pallor or seizures. Pertinent negatives for diabetes include no chest pain, no fatigue, no polydipsia, no polyphagia and no polyuria. There are no hypoglycemic complications. Symptoms are improving. There are no diabetic complications. Risk factors for coronary artery disease include diabetes mellitus, hypertension, obesity and sedentary lifestyle. Current diabetic treatment includes oral agent (dual therapy) (She is currently on Ozempic 1 mg subcutaneously weekly, metformin 1000 mg p.o. twice daily, and glipizide 5 mg XL p.o. daily at breakfast.). Her weight is fluctuating minimally. She is following a diabetic and generally unhealthy diet. When asked about meal planning, she reported none. She has not had a previous visit with a dietitian. She participates in exercise intermittently. An ACE inhibitor/angiotensin II receptor blocker is being taken. She does not see a podiatrist.Eye exam is not current.  Hypertension This is a chronic problem. The current episode started more than 1 year ago. The problem is controlled. Pertinent negatives include no chest pain, headaches, palpitations or shortness of breath. Risk factors for coronary artery disease include diabetes mellitus, obesity and sedentary lifestyle. Past treatments include ACE inhibitors.  Hyperlipidemia This is a chronic problem. The current episode started more than 1 year ago. The problem is controlled. Exacerbating diseases include diabetes and obesity. Pertinent negatives include no chest pain, myalgias or shortness of breath. Current antihyperlipidemic treatment includes statins. Risk factors for coronary artery disease include diabetes mellitus, dyslipidemia, hypertension, obesity and a sedentary lifestyle.   Review of systems: Limited as  above.  Objective:    There were no vitals taken for this visit.  Wt Readings from Last 3 Encounters:  07/23/18 232 lb (105.2 kg)  07/23/18 232 lb (105.2 kg)  01/19/18 230 lb (104.3 kg)     Recent Results (from the past 2160 hour(s))  Basic metabolic panel     Status: Abnormal   Collection Time: 06/21/19 12:00 AM  Result Value Ref Range   BUN 28 (A) 4 - 21   Creatinine 0.6 0.5 - 1.1  Hemoglobin A1c     Status: None   Collection Time: 06/21/19 12:00 AM  Result Value Ref Range   Hemoglobin A1C 7.1   Lipid Panel     Component Value Date/Time   CHOL 150 07/14/2018   TRIG 85 07/14/2018   HDL 50 07/14/2018   LDLCALC 83 07/14/2018    January 12, 2018 A1c 6.8% October 14, 2017 A1c 7.9% 12/10/2013 A1c 9.1%.  Assessment & Plan:   1. Uncontrolled type 2 diabetes mellitus with complication, without long-term current use of insulin (HCC)  - Patient has currently uncontrolled symptomatic type 2 DM since  48 years of age. -Her previsit labs show A1c of 7.1% improving from 8.2%.  She denies hypoglycemia.  -I reviewed her recent labs which include normal renal function.     Her diabetes is complicated by obesity/sedentary life and patient remains at a high risk for more acute and chronic complications of diabetes which include CAD, CVA, CKD, retinopathy, and neuropathy. These are all discussed in detail with the patient.  - I have counseled the patient on diet management and weight loss, by adopting a carbohydrate restricted/protein rich diet.  - she  admits there is a room for improvement in her diet and drink choices. -  Suggestion is made for her to avoid simple carbohydrates  from her diet including Cakes, Sweet Desserts / Pastries, Ice Cream, Soda (diet and regular), Sweet Tea, Candies, Chips, Cookies, Sweet Pastries,  Store Bought Juices, Alcohol in Excess of  1-2 drinks a day, Artificial Sweeteners, Coffee Creamer, and "Sugar-free" Products. This will help patient to have stable  blood glucose profile and potentially avoid unintended weight gain.  - I encouraged the patient to switch to  unprocessed or minimally processed complex starch and increased protein intake (animal or plant source), fruits, and vegetables.  - Patient is advised to stick to a routine mealtimes to eat 3 meals  a day and avoid unnecessary snacks ( to snack only to correct hypoglycemia).   - I have approached patient with the following individualized plan to manage diabetes and patient agrees:   - She remains motivated,  wants to minimize medications and willing to maximize lifestyle modification.  -She is advised to continue glipizide 5 mg XL p.o. daily at breakfast along with her metformin 1000 mg p.o. twice daily and Ozempic 1 mg subcutaneously weekly.   2) BP/HTN:  she is advised to home monitor blood pressure and report if > 140/90 on 2 separate readings. She is advised to continue her current blood pressure medications including lisinopril 20 mg p.o. daily at breakfast.   3) Lipids/HPL: Her lipid panel from last visit was controlled with LDL of 83, improving from 409.  She is advised to continue atorvastatin 20 mg p.o. daily at bedtime. Side effects and precautions discussed with her.  4)  Weight/Diet: CDE Consult in progress  , exercise, and detailed carbohydrates information provided.  5) Chronic Care/Health Maintenance:  -Patient is on ACEI and encouraged to continue to follow up with Ophthalmology, Podiatrist at least yearly or according to recommendations, and advised to   stay away from smoking. I have recommended yearly flu vaccine and pneumonia vaccination at least every 5 years; moderate intensity exercise for up to 150 minutes weekly; and  sleep for at least 7 hours a day.  - I advised patient to maintain close follow up with for primary care needs.  - Patient Care Time Today:  25 min, of which >50% was spent in  counseling and the rest reviewing her  current and  previous  labs/studies, previous treatments, her blood glucose readings, and medications' doses and developing a plan for long-term care based on the latest recommendations for standards of care.   Siomara Firestine participated in the discussions, expressed understanding, and voiced agreement with the above plans.  All questions were answered to  her satisfaction. she is encouraged to contact clinic should she have any questions or concerns prior to her return visit.   Follow up plan: - Return in about 4 months (around 10/25/2019) for Bring Meter and Logs- A1c in Office.  Glade Lloyd, MD Phone: [phone]  Fax: [phone]  -  This note was partially dictated with voice recognition software. Similar sounding words can be transcribed inadequately or may not  be corrected upon review.  06/25/2019, 11:39 AM

## 2019-06-28 ENCOUNTER — Telehealth: Payer: Self-pay | Admitting: "Endocrinology

## 2019-06-28 NOTE — Telephone Encounter (Signed)
I want her to stay on extended release metformin.

## 2019-06-28 NOTE — Telephone Encounter (Signed)
Patient said that she has always took the extended release metformin and wanted to know if Dr Dorris Fetch wanted her to stay on that or take what he did call in on 9/25.

## 2019-06-29 NOTE — Telephone Encounter (Signed)
Pt.notified

## 2019-07-22 ENCOUNTER — Other Ambulatory Visit: Payer: Self-pay | Admitting: "Endocrinology

## 2019-10-25 ENCOUNTER — Encounter: Payer: Self-pay | Admitting: "Endocrinology

## 2019-10-25 ENCOUNTER — Ambulatory Visit: Payer: 59 | Admitting: "Endocrinology

## 2019-10-25 ENCOUNTER — Ambulatory Visit (INDEPENDENT_AMBULATORY_CARE_PROVIDER_SITE_OTHER): Payer: 59 | Admitting: "Endocrinology

## 2019-10-25 ENCOUNTER — Other Ambulatory Visit: Payer: Self-pay

## 2019-10-25 VITALS — BP 137/85 | HR 80 | Ht 66.0 in | Wt 261.6 lb

## 2019-10-25 DIAGNOSIS — E782 Mixed hyperlipidemia: Secondary | ICD-10-CM

## 2019-10-25 DIAGNOSIS — E118 Type 2 diabetes mellitus with unspecified complications: Secondary | ICD-10-CM | POA: Diagnosis not present

## 2019-10-25 DIAGNOSIS — E1165 Type 2 diabetes mellitus with hyperglycemia: Secondary | ICD-10-CM | POA: Diagnosis not present

## 2019-10-25 DIAGNOSIS — I10 Essential (primary) hypertension: Secondary | ICD-10-CM

## 2019-10-25 DIAGNOSIS — IMO0002 Reserved for concepts with insufficient information to code with codable children: Secondary | ICD-10-CM

## 2019-10-25 LAB — POCT GLYCOSYLATED HEMOGLOBIN (HGB A1C): Hemoglobin A1C: 8.2 % — AB (ref 4.0–5.6)

## 2019-10-25 MED ORDER — GLIPIZIDE ER 5 MG PO TB24: 5.0000 mg | ORAL_TABLET | Freq: Every day | ORAL | 3 refills | Status: DC

## 2019-10-25 NOTE — Patient Instructions (Signed)

## 2019-10-25 NOTE — Progress Notes (Signed)
10/25/2019  Endocrinology follow-up note   Subjective:    Patient ID: Stacy Clarke, female    DOB: 17-Jul-1971. Patient is being seen in follow-up for management of type 2  Diabetes, HTN requested by  Joaquin Courts, DO  Past Medical History:  Diagnosis Date  . HTN (hypertension) 08/28/2012    Social History   Socioeconomic History  . Marital status: Married    Spouse name: Not on file  . Number of children: Not on file  . Years of education: Not on file  . Highest education level: Not on file  Occupational History  . Not on file  Tobacco Use  . Smoking status: Former Games developer  . Smokeless tobacco: Never Used  Substance and Sexual Activity  . Alcohol use: No  . Drug use: No  . Sexual activity: Not on file  Other Topics Concern  . Not on file  Social History Narrative  . Not on file   Social Determinants of Health   Financial Resource Strain:   . Difficulty of Paying Living Expenses: Not on file  Food Insecurity:   . Worried About Programme researcher, broadcasting/film/video in the Last Year: Not on file  . Ran Out of Food in the Last Year: Not on file  Transportation Needs:   . Lack of Transportation (Medical): Not on file  . Lack of Transportation (Non-Medical): Not on file  Physical Activity:   . Days of Exercise per Week: Not on file  . Minutes of Exercise per Session: Not on file  Stress:   . Feeling of Stress : Not on file  Social Connections:   . Frequency of Communication with Friends and Family: Not on file  . Frequency of Social Gatherings with Friends and Family: Not on file  . Attends Religious Services: Not on file  . Active Member of Clubs or Organizations: Not on file  . Attends Banker Meetings: Not on file  . Marital Status: Not on file   Outpatient Encounter Medications as of 10/25/2019  Medication Sig  . atorvastatin (LIPITOR) 20 MG tablet Take 1 tablet (20 mg total) by mouth daily.  Marland Kitchen glipiZIDE (GLUCOTROL XL) 5 MG 24 hr tablet Take  1 tablet (5 mg total) by mouth daily with breakfast.  . lisinopril (PRINIVIL,ZESTRIL) 20 MG tablet Take 20 mg by mouth daily.  . metFORMIN (GLUCOPHAGE) 500 MG tablet Take 2 tablets (1,000 mg total) by mouth 2 (two) times daily after a meal.  . ONETOUCH VERIO test strip USE AS INSTRUCTED  . PARoxetine (PAXIL) 20 MG tablet Take 20 mg by mouth daily.  . Semaglutide, 1 MG/DOSE, 2 MG/1.5ML SOPN Inject 1 mg into the skin once a week.  . [DISCONTINUED] glipiZIDE (GLUCOTROL XL) 5 MG 24 hr tablet TAKE 1 TABLET BY MOUTH EVERY DAY WITH BREAKFAST  . [DISCONTINUED] ranitidine (ZANTAC) 150 MG tablet Take 150 mg by mouth at bedtime.   No facility-administered encounter medications on file as of 10/25/2019.   ALLERGIES: No Known Allergies VACCINATION STATUS:  There is no immunization history on file for this patient.  Diabetes She presents for her follow-up diabetic visit. She has type 2 diabetes mellitus. Onset time: She was diagnosed at approximate age of 62 years after episodes of gestational diabetes with 3 of her pregnancies from 40 until 2014. Her disease course has been worsening. There are no hypoglycemic associated symptoms. Pertinent negatives for hypoglycemia include no confusion, headaches, pallor or  seizures. Pertinent negatives for diabetes include no chest pain, no fatigue, no polydipsia, no polyphagia and no polyuria. There are no hypoglycemic complications. Symptoms are worsening. There are no diabetic complications. Risk factors for coronary artery disease include diabetes mellitus, hypertension, obesity and sedentary lifestyle. Current diabetic treatment includes oral agent (dual therapy) (She is currently on Ozempic 1 mg subcutaneously weekly, metformin 1000 mg p.o. twice daily, and glipizide 5 mg XL p.o. daily at breakfast.). Her weight is increasing steadily. She is following a diabetic and generally unhealthy diet. When asked about meal planning, she reported none. She has not had a  previous visit with a dietitian. She participates in exercise intermittently. (She denies bring any logs nor meter with her.  Her point-of-care A1c is 8.2%, increasing.) An ACE inhibitor/angiotensin II receptor blocker is being taken. She does not see a podiatrist.Eye exam is not current.  Hypertension This is a chronic problem. The current episode started more than 1 year ago. The problem is controlled. Pertinent negatives include no chest pain, headaches, palpitations or shortness of breath. Risk factors for coronary artery disease include diabetes mellitus, obesity and sedentary lifestyle. Past treatments include ACE inhibitors.  Hyperlipidemia This is a chronic problem. The current episode started more than 1 year ago. The problem is controlled. Exacerbating diseases include diabetes and obesity. Pertinent negatives include no chest pain, myalgias or shortness of breath. Current antihyperlipidemic treatment includes statins. Risk factors for coronary artery disease include diabetes mellitus, dyslipidemia, hypertension, obesity and a sedentary lifestyle.   Review of systems:  Constitutional: +weight gain, no fatigue, no subjective hyperthermia, no subjective hypothermia Eyes: no blurry vision, no xerophthalmia ENT: no sore throat, no nodules palpated in throat, no dysphagia/odynophagia, no hoarseness Cardiovascular: no Chest Pain, no Shortness of Breath, no palpitations, no leg swelling Respiratory: no cough, no SOB Gastrointestinal: no Nausea/Vomiting/Diarhhea Musculoskeletal: no muscle/joint aches Skin: no rashes Neurological: no tremors, no numbness, no tingling, no dizziness Psychiatric: no depression, no anxiety   Objective:    BP 137/85   Pulse 80   Ht 5\' 6"  (1.676 m)   Wt 261 lb 9.6 oz (118.7 kg)   BMI 42.22 kg/m   Wt Readings from Last 3 Encounters:  10/25/19 261 lb 9.6 oz (118.7 kg)  07/23/18 232 lb (105.2 kg)  07/23/18 232 lb (105.2 kg)     Physical Exam-  Limited  Constitutional:  Body mass index is 42.22 kg/m. , not in acute distress, normal state of mind Eyes:  EOMI, no exophthalmos Neck: Supple Respiratory: Adequate breathing efforts Musculoskeletal: no gross deformities, strength intact in all four extremities, no gross restriction of joint movements Skin:  no rashes, no hyperemia Neurological: no tremor with outstretched hands.  Recent Results (from the past 2160 hour(s))  HgB A1c     Status: Abnormal   Collection Time: 10/25/19  1:50 PM  Result Value Ref Range   Hemoglobin A1C 8.2 (A) 4.0 - 5.6 %   HbA1c POC (<> result, manual entry)     HbA1c, POC (prediabetic range)     HbA1c, POC (controlled diabetic range)    Lipid Panel     Component Value Date/Time   CHOL 150 07/14/2018 0000   TRIG 85 07/14/2018 0000   HDL 50 07/14/2018 0000   LDLCALC 83 07/14/2018 0000    January 12, 2018 A1c 6.8% October 14, 2017 A1c 7.9% 12/10/2013 A1c 9.1%.  Assessment & Plan:   1. Uncontrolled type 2 diabetes mellitus with complication, without long-term current use of insulin (HCC)  -  Patient has currently uncontrolled symptomatic type 2 DM since  49 years of age. -Her point-of-care A1c is 8.2%, increasing from 7.1%.    She did not bring any logs nor meter, however she denies hypoglycemia.  -I reviewed her recent labs which include normal renal function.     Her diabetes is complicated by obesity/sedentary life and patient remains at a high risk for more acute and chronic complications of diabetes which include CAD, CVA, CKD, retinopathy, and neuropathy. These are all discussed in detail with the patient.  - I have counseled the patient on diet management and weight loss, by adopting a carbohydrate restricted/protein rich diet.  - she  admits there is a room for improvement in her diet and drink choices. -  Suggestion is made for her to avoid simple carbohydrates  from her diet including Cakes, Sweet Desserts / Pastries, Ice Cream, Soda  (diet and regular), Sweet Tea, Candies, Chips, Cookies, Sweet Pastries,  Store Bought Juices, Alcohol in Excess of  1-2 drinks a day, Artificial Sweeteners, Coffee Creamer, and "Sugar-free" Products. This will help patient to have stable blood glucose profile and potentially avoid unintended weight gain.   - I encouraged the patient to switch to  unprocessed or minimally processed complex starch and increased protein intake (animal or plant source), fruits, and vegetables.  - Patient is advised to stick to a routine mealtimes to eat 3 meals  a day and avoid unnecessary snacks ( to snack only to correct hypoglycemia).   - I have approached patient with the following individualized plan to manage diabetes and patient agrees:   - She admits that she was distracted from self-care during the winter/pandemic visit.  She wants to reengage in.  Intensive lifestyle modification.   She will not need insulin treatment for now. -She is advised to continue glipizide 100 XL p.o. daily at breakfast, continue Metformin 1000 mg p.o. twice daily, and Ozempic 1 mg subcutaneously weekly.     2) BP/HTN: Her blood pressure is controlled to target.   She is advised to continue her current blood pressure medications including lisinopril 20 mg p.o. daily at breakfast.   3) Lipids/HPL: Her lipid panel from last visit was controlled with LDL of 83, improving from 121.  She is advised to continue atorvastatin 20 mg p.o. daily at bedtime.   Side effects and precautions discussed with her.  4)  Weight/Diet: CDE Consult in progress  , exercise, and detailed carbohydrates information provided.  5) Chronic Care/Health Maintenance:  -Patient is on ACEI and encouraged to continue to follow up with Ophthalmology, Podiatrist at least yearly or according to recommendations, and advised to   stay away from smoking. I have recommended yearly flu vaccine and pneumonia vaccination at least every 5 years; moderate intensity exercise  for up to 150 minutes weekly; and  sleep for at least 7 hours a day.  - I advised patient to maintain close follow up with for primary care needs.  - Time spent on this patient care encounter:  35 min, of which >50% was spent in  counseling and the rest reviewing her  current and  previous labs/studies ( including abstraction from other facilities),  previous treatments, her blood glucose readings, and medications' doses and developing a plan for long-term care based on the latest recommendations for standards of care; and documenting her care.  Rabia Heron participated in the discussions, expressed understanding, and voiced agreement with the above plans.  All questions were answered to her satisfaction.  she is encouraged to contact clinic should she have any questions or concerns prior to her return visit.   Follow up plan: - Return in about 6 months (around 04/23/2020) for Bring Meter and Logs- A1c in Office, Follow up with Pre-visit Labs.  Marquis Lunch, MD Phone: [phone]  Fax: [phone]  -  This note was partially dictated with voice recognition software. Similar sounding words can be transcribed inadequately or may not  be corrected upon review.  10/25/2019, 5:54 PM

## 2019-12-03 ENCOUNTER — Other Ambulatory Visit: Payer: Self-pay | Admitting: "Endocrinology

## 2020-01-18 ENCOUNTER — Other Ambulatory Visit: Payer: Self-pay | Admitting: "Endocrinology

## 2020-01-19 NOTE — Telephone Encounter (Signed)
I wanted to clarify if you want pt to continue the 5mg  or increase to 10mg . Pt's med list states 5mg  but in her last office visit note from January  it states 100.

## 2020-02-07 ENCOUNTER — Other Ambulatory Visit: Payer: Self-pay | Admitting: "Endocrinology

## 2020-04-19 ENCOUNTER — Encounter: Payer: Self-pay | Admitting: "Endocrinology

## 2020-04-19 LAB — VITAMIN D 25 HYDROXY (VIT D DEFICIENCY, FRACTURES): Vit D, 25-Hydroxy: 24.5

## 2020-04-19 LAB — BASIC METABOLIC PANEL
BUN: 13 (ref 4–21)
CO2: 27 — AB (ref 13–22)
Chloride: 101 (ref 99–108)
Creatinine: 0.8 (ref 0.5–1.1)
Glucose: 130
Potassium: 4.3 (ref 3.4–5.3)
Sodium: 138 (ref 137–147)

## 2020-04-19 LAB — COMPREHENSIVE METABOLIC PANEL
Calcium: 9.3 (ref 8.7–10.7)
GFR calc Af Amer: 99
GFR calc non Af Amer: 86

## 2020-04-19 LAB — MICROALBUMIN, URINE: Microalb, Ur: 1.3

## 2020-04-19 LAB — TSH: TSH: 3.36 (ref 0.41–5.90)

## 2020-04-19 NOTE — Telephone Encounter (Signed)
Pt is calling and states she got a Cortizone shot in both her knees and now her sugar is very high. Requesting to speak with nurse.

## 2020-04-19 NOTE — Telephone Encounter (Signed)
Error

## 2020-04-26 ENCOUNTER — Ambulatory Visit (INDEPENDENT_AMBULATORY_CARE_PROVIDER_SITE_OTHER): Payer: 59 | Admitting: Nurse Practitioner

## 2020-04-26 ENCOUNTER — Encounter: Payer: Self-pay | Admitting: Nurse Practitioner

## 2020-04-26 ENCOUNTER — Other Ambulatory Visit: Payer: Self-pay

## 2020-04-26 VITALS — BP 113/76 | HR 93 | Ht 66.0 in | Wt 249.4 lb

## 2020-04-26 DIAGNOSIS — E118 Type 2 diabetes mellitus with unspecified complications: Secondary | ICD-10-CM

## 2020-04-26 DIAGNOSIS — E782 Mixed hyperlipidemia: Secondary | ICD-10-CM | POA: Diagnosis not present

## 2020-04-26 DIAGNOSIS — I10 Essential (primary) hypertension: Secondary | ICD-10-CM

## 2020-04-26 DIAGNOSIS — E1165 Type 2 diabetes mellitus with hyperglycemia: Secondary | ICD-10-CM

## 2020-04-26 DIAGNOSIS — IMO0002 Reserved for concepts with insufficient information to code with codable children: Secondary | ICD-10-CM

## 2020-04-26 LAB — POCT GLYCOSYLATED HEMOGLOBIN (HGB A1C): Hemoglobin A1C: 6.8 % — AB (ref 4.0–5.6)

## 2020-04-26 NOTE — Patient Instructions (Signed)

## 2020-04-26 NOTE — Progress Notes (Signed)
04/26/2020  Endocrinology follow-up note   Subjective:    Patient ID: Stacy Clarke, female    DOB: 25-Oct-1970. Patient is being seen in follow-up for management of type 2  Diabetes, HTN requested by  Joaquin Courts, DO  Past Medical History:  Diagnosis Date   HTN (hypertension) 08/28/2012    Social History   Socioeconomic History   Marital status: Married    Spouse name: Not on file   Number of children: Not on file   Years of education: Not on file   Highest education level: Not on file  Occupational History   Not on file  Tobacco Use   Smoking status: Former Smoker   Smokeless tobacco: Never Used  Building services engineer Use: Never used  Substance and Sexual Activity   Alcohol use: No   Drug use: No   Sexual activity: Not on file  Other Topics Concern   Not on file  Social History Narrative   Not on file   Social Determinants of Health   Financial Resource Strain:    Difficulty of Paying Living Expenses:   Food Insecurity:    Worried About Programme researcher, broadcasting/film/video in the Last Year:    Barista in the Last Year:   Transportation Needs:    Freight forwarder (Medical):    Lack of Transportation (Non-Medical):   Physical Activity:    Days of Exercise per Week:    Minutes of Exercise per Session:   Stress:    Feeling of Stress :   Social Connections:    Frequency of Communication with Friends and Family:    Frequency of Social Gatherings with Friends and Family:    Attends Religious Services:    Active Member of Clubs or Organizations:    Attends Banker Meetings:    Marital Status:    Outpatient Encounter Medications as of 04/26/2020  Medication Sig   atorvastatin (LIPITOR) 20 MG tablet Take 1 tablet (20 mg total) by mouth daily.   cholecalciferol (VITAMIN D3) 25 MCG (1000 UNIT) tablet Take 5,000 Units by mouth daily.   glipiZIDE (GLUCOTROL XL) 5 MG 24 hr tablet TAKE 1 TABLET BY MOUTH  EVERY DAY WITH BREAKFAST   glucose blood (ONETOUCH VERIO) test strip Use as instructed to test blood glucose two times daily   lisinopril (PRINIVIL,ZESTRIL) 20 MG tablet Take 20 mg by mouth daily.   metFORMIN (GLUCOPHAGE) 500 MG tablet Take 2 tablets (1,000 mg total) by mouth 2 (two) times daily after a meal.   OZEMPIC, 1 MG/DOSE, 2 MG/1.5ML SOPN INJECT 1 MG SUBCUTANEOUSLY ONCE A WEEK   PARoxetine (PAXIL) 20 MG tablet Take 20 mg by mouth daily.   cetirizine (ZYRTEC) 10 MG tablet Take by mouth.   famotidine (PEPCID) 20 MG tablet Take by mouth.   No facility-administered encounter medications on file as of 04/26/2020.   ALLERGIES: No Known Allergies VACCINATION STATUS:  There is no immunization history on file for this patient.  Diabetes She presents for her follow-up diabetic visit. She has type 2 diabetes mellitus. Onset time: She was diagnosed at approximate age of 49 years after episodes of gestational diabetes with 3 of her pregnancies from 32 until 2014. Her disease course has been improving. There are no hypoglycemic associated symptoms. Pertinent negatives for hypoglycemia include no confusion, headaches, pallor, sweats or tremors. Pertinent negatives for diabetes include no chest pain, no fatigue, no  polydipsia, no polyphagia and no polyuria. There are no hypoglycemic complications. Symptoms are improving. There are no diabetic complications. Risk factors for coronary artery disease include diabetes mellitus, hypertension, obesity and sedentary lifestyle. Current diabetic treatment includes oral agent (dual therapy) (She is currently on Ozempic 1 mg subcutaneously weekly, metformin 1000 mg p.o. twice daily, and glipizide 5 mg XL p.o. daily at breakfast.). She is compliant with treatment most of the time. Her weight is increasing steadily. She is following a diabetic diet. When asked about meal planning, she reported none. She has not had a previous visit with a dietitian. She  participates in exercise intermittently. Her home blood glucose trend is decreasing steadily. Her breakfast blood glucose range is generally 110-130 mg/dl. (She presents with her meter today.  She denies any and there are no documented hypoglycemic episodes.  She checks her blood glucose nearly every morning.  Her A1c today is 6.8% overall improving from 8.2%.  She has lost 12 pounds since last visit. ) An ACE inhibitor/angiotensin II receptor blocker is being taken. She does not see a podiatrist.Eye exam is not current.  Hypertension This is a chronic problem. The current episode started more than 1 year ago. The problem is controlled. Pertinent negatives include no chest pain, headaches, palpitations, shortness of breath or sweats. Risk factors for coronary artery disease include diabetes mellitus, obesity and sedentary lifestyle. Past treatments include ACE inhibitors.  Hyperlipidemia This is a chronic problem. The current episode started more than 1 year ago. The problem is controlled. Exacerbating diseases include diabetes and obesity. Pertinent negatives include no chest pain, myalgias or shortness of breath. Current antihyperlipidemic treatment includes statins. Risk factors for coronary artery disease include diabetes mellitus, dyslipidemia, hypertension, obesity and a sedentary lifestyle.   Review of systems:  Constitutional: + Significant weight loss (12 pounds), no fatigue, no subjective hyperthermia, no subjective hypothermia Eyes: no blurry vision, no xerophthalmia Cardiovascular: no Chest Pain, no Shortness of Breath, no palpitations, no leg swelling Respiratory: no cough, no SOB Gastrointestinal: no Nausea/Vomiting/Diarhhea Musculoskeletal: no muscle/joint aches Skin: no rashes Neurological: no tremors, no numbness, no tingling, no dizziness Psychiatric: no depression, no anxiety   Objective:    BP 113/76 (BP Location: Left Arm, Patient Position: Sitting)    Pulse 93    Ht 5\' 6"   (1.676 m)    Wt (!) 249 lb 6.4 oz (113.1 kg)    BMI 40.25 kg/m   Wt Readings from Last 3 Encounters:  04/26/20 (!) 249 lb 6.4 oz (113.1 kg)  10/25/19 261 lb 9.6 oz (118.7 kg)  07/23/18 232 lb (105.2 kg)      Physical Exam- Limited  Constitutional:  Body mass index is 40.25 kg/m. , not in acute distress, normal state of mind Eyes:  EOMI, no exophthalmos Neck: Supple Thyroid: No gross goiter Cardiovascular: RRR, no murmers, rubs, or gallops, no edema Respiratory: Adequate breathing efforts, no crackles, rales, rhonchi, or wheezing Musculoskeletal: no gross deformities, strength intact in all four extremities, no gross restriction of joint movements Skin:  no rashes, no hyperemia Neurological: no tremor with outstretched hands   Foot exam:   No rashes, ulcers, cuts, calluses, onychodystrophy.   Good pulses bilat.  Good sensation to 10 g monofilament bilat.    Recent Results (from the past 2160 hour(s))  Microalbumin, urine     Status: None   Collection Time: 04/19/20 12:00 AM  Result Value Ref Range   Microalb, Ur 1.3   VITAMIN D 25 Hydroxy (Vit-D Deficiency, Fractures)  Status: None   Collection Time: 04/19/20 12:00 AM  Result Value Ref Range   Vit D, 25-Hydroxy 24.5   Basic metabolic panel     Status: Abnormal   Collection Time: 04/19/20 12:00 AM  Result Value Ref Range   Glucose 130    BUN 13 4 - 21   CO2 27 (A) 13 - 22   Creatinine 0.8 0.5 - 1.1   Potassium 4.3 3.4 - 5.3   Sodium 138 137 - 147   Chloride 101 99 - 108  Comprehensive metabolic panel     Status: None   Collection Time: 04/19/20 12:00 AM  Result Value Ref Range   GFR calc Af Amer 99    GFR calc non Af Amer 86    Calcium 9.3 8.7 - 10.7  TSH     Status: None   Collection Time: 04/19/20 12:00 AM  Result Value Ref Range   TSH 3.36 0.41 - 5.90    Comment: FREE T4-0.67  HgB A1c     Status: Abnormal   Collection Time: 04/26/20  2:04 PM  Result Value Ref Range   Hemoglobin A1C 6.8 (A) 4.0 -  5.6 %   HbA1c POC (<> result, manual entry)     HbA1c, POC (prediabetic range)     HbA1c, POC (controlled diabetic range)    Lipid Panel     Component Value Date/Time   CHOL 150 07/14/2018 0000   TRIG 85 07/14/2018 0000   HDL 50 07/14/2018 0000   LDLCALC 83 07/14/2018 0000      Assessment & Plan:   1. Uncontrolled type 2 diabetes mellitus with complication, without long-term current use of insulin (HCC)  - Patient has currently uncontrolled symptomatic type 2 DM since  49 years of age.  She presents with her meter today.  She denies any and there are no documented hypoglycemic episodes.  She checks her blood glucose nearly every morning.  Her A1c today is 6.8% overall improving from 8.2%.  She has lost 12 pounds since last visit.  -I reviewed her recent labs which include normal renal function.     Her diabetes is complicated by obesity/sedentary life and patient remains at a high risk for more acute and chronic complications of diabetes which include CAD, CVA, CKD, retinopathy, and neuropathy. These are all discussed in detail with the patient.  - I have counseled the patient on diet management and weight loss, by adopting a carbohydrate restricted/protein rich diet.  - The patient admits there is a room for improvement in their diet and drink choices. -  Suggestion is made for the patient to avoid simple carbohydrates from their diet including Cakes, Sweet Desserts / Pastries, Ice Cream, Soda (diet and regular), Sweet Tea, Candies, Chips, Cookies, Sweet Pastries,  Store Bought Juices, Alcohol in Excess of  1-2 drinks a day, Artificial Sweeteners, Coffee Creamer, and "Sugar-free" Products. This will help patient to have stable blood glucose profile and potentially avoid unintended weight gain.   - I encouraged the patient to switch to  unprocessed or minimally processed complex starch and increased protein intake (animal or plant source), fruits, and vegetables.   - Patient is  advised to stick to a routine mealtimes to eat 3 meals  a day and avoid unnecessary snacks ( to snack only to correct hypoglycemia).  - I have approached patient with the following individualized plan to manage diabetes and patient agrees:   - She does not need insulin treatment for now.   -  She is advised to continue Ozempic 1 mg weekly, Metformin 1000 mg twice daily with meals, glipizide XL 5 mg daily with breakfast.  2) BP/HTN: Her blood pressure is controlled to target.  She is advised to continue lisinopril 20 mg p.o. daily.   3) Lipids/HPL:  Her recent lipid panel shows improving LDL of 84.  She is advised to continue atorvastatin 20 mg p.o. daily at bedtime.  Side effects and precautions have been discussed with her.  4)  Weight/Diet:  CDE Consult in progress  , exercise, and detailed carbohydrates information provided.   5) Chronic Care/Health Maintenance:  -Patient is on ACEI and encouraged to continue to follow up with Ophthalmology, Podiatrist at least yearly or according to recommendations, and advised to   stay away from smoking. I have recommended yearly flu vaccine and pneumonia vaccination at least every 5 years; moderate intensity exercise for up to 150 minutes weekly; and  sleep for at least 7 hours a day.  - I advised patient to maintain close follow up with for primary care needs.  - Time spent on this patient care encounter:  35 min, of which > 50% was spent in  counseling and the rest reviewing her blood glucose logs , discussing her hypoglycemia and hyperglycemia episodes, reviewing her current and  previous labs / studies  ( including abstraction from other facilities) and medications  doses and developing a  long term treatment plan and documenting her care.   Please refer to Patient Instructions for Blood Glucose Monitoring and Insulin/Medications Dosing Guide"  in media tab for additional information. Please  also refer to " Patient Self Inventory" in the Media  tab  for reviewed elements of pertinent patient history.  Eris Detweiler participated in the discussions, expressed understanding, and voiced agreement with the above plans.  All questions were answered to her satisfaction. she is encouraged to contact clinic should she have any questions or concerns prior to her return visit.  Follow up plan: - Return in about 4 months (around 08/27/2020) for Diabetes follow up with A1c in office.  Ronny Bacon, FNP-BC [facility] Phone: [phone] Fax: [phone]  04/26/2020, 2:23 PM

## 2020-05-24 ENCOUNTER — Other Ambulatory Visit: Payer: Self-pay | Admitting: "Endocrinology

## 2020-05-24 NOTE — Telephone Encounter (Signed)
She was only testing once per day before my visit with her.  I encouraged her to test twice daily (before breakfast and before bed)

## 2020-05-24 NOTE — Telephone Encounter (Signed)
Did you want her testing once daily or twice daily? Please advise.

## 2020-07-26 ENCOUNTER — Other Ambulatory Visit: Payer: Self-pay | Admitting: "Endocrinology

## 2020-08-28 ENCOUNTER — Other Ambulatory Visit: Payer: Self-pay

## 2020-08-28 ENCOUNTER — Ambulatory Visit (INDEPENDENT_AMBULATORY_CARE_PROVIDER_SITE_OTHER): Payer: 59 | Admitting: Nurse Practitioner

## 2020-08-28 ENCOUNTER — Encounter: Payer: Self-pay | Admitting: Nurse Practitioner

## 2020-08-28 VITALS — BP 132/82 | HR 94

## 2020-08-28 DIAGNOSIS — E118 Type 2 diabetes mellitus with unspecified complications: Secondary | ICD-10-CM

## 2020-08-28 DIAGNOSIS — E1165 Type 2 diabetes mellitus with hyperglycemia: Secondary | ICD-10-CM

## 2020-08-28 DIAGNOSIS — I10 Essential (primary) hypertension: Secondary | ICD-10-CM | POA: Diagnosis not present

## 2020-08-28 DIAGNOSIS — E782 Mixed hyperlipidemia: Secondary | ICD-10-CM

## 2020-08-28 DIAGNOSIS — IMO0002 Reserved for concepts with insufficient information to code with codable children: Secondary | ICD-10-CM

## 2020-08-28 LAB — POCT GLYCOSYLATED HEMOGLOBIN (HGB A1C): HbA1c, POC (controlled diabetic range): 7.4 % — AB (ref 0.0–7.0)

## 2020-08-28 NOTE — Progress Notes (Signed)
08/28/2020  Endocrinology follow-up note   Subjective:    Patient ID: Stacy Clarke, female    DOB: [birth year]. Patient is being seen in follow-up for management of type 2  Diabetes, HTN requested by  Joaquin CourtsFavero, John Patrick, DO  Past Medical History:  Diagnosis Date  . HTN (hypertension) 08/28/2012    Social History   Socioeconomic History  . Marital status: Married    Spouse name: Not on file  . Number of children: Not on file  . Years of education: Not on file  . Highest education level: Not on file  Occupational History  . Not on file  Tobacco Use  . Smoking status: Former Games developermoker  . Smokeless tobacco: Never Used  Vaping Use  . Vaping Use: Never used  Substance and Sexual Activity  . Alcohol use: No  . Drug use: No  . Sexual activity: Not on file  Other Topics Concern  . Not on file  Social History Narrative  . Not on file   Social Determinants of Health   Financial Resource Strain:   . Difficulty of Paying Living Expenses: Not on file  Food Insecurity:   . Worried About Programme researcher, broadcasting/film/videounning Out of Food in the Last Year: Not on file  . Ran Out of Food in the Last Year: Not on file  Transportation Needs:   . Lack of Transportation (Medical): Not on file  . Lack of Transportation (Non-Medical): Not on file  Physical Activity:   . Days of Exercise per Week: Not on file  . Minutes of Exercise per Session: Not on file  Stress:   . Feeling of Stress : Not on file  Social Connections:   . Frequency of Communication with Friends and Family: Not on file  . Frequency of Social Gatherings with Friends and Family: Not on file  . Attends Religious Services: Not on file  . Active Member of Clubs or Organizations: Not on file  . Attends BankerClub or Organization Meetings: Not on file  . Marital Status: Not on file   Outpatient Encounter Medications as of 08/28/2020  Medication Sig  . aspirin 81 MG EC tablet Take 81 mg by mouth daily. Swallow whole.  Marland Kitchen. atorvastatin  (LIPITOR) 20 MG tablet Take 1 tablet (20 mg total) by mouth daily.  . cetirizine (ZYRTEC) 10 MG tablet Take by mouth.  . cholecalciferol (VITAMIN D3) 25 MCG (1000 UNIT) tablet Take 5,000 Units by mouth daily.  . famotidine (PEPCID) 20 MG tablet Take by mouth.  Marland Kitchen. glipiZIDE (GLUCOTROL XL) 5 MG 24 hr tablet TAKE 1 TABLET BY MOUTH EVERY DAY WITH BREAKFAST  . lisinopril (PRINIVIL,ZESTRIL) 20 MG tablet Take 20 mg by mouth daily.  . metFORMIN (GLUCOPHAGE) 500 MG tablet Take 2 tablets (1,000 mg total) by mouth 2 (two) times daily after a meal.  . ONETOUCH VERIO test strip USE AS INSTRUCTED TO TEST BLOOD GLUCOSE TWO TIMES DAILY  . OZEMPIC, 1 MG/DOSE, 2 MG/1.5ML SOPN INJECT 1 MG SUBCUTANEOUSLY ONCE A WEEK  . PARoxetine (PAXIL) 20 MG tablet Take 20 mg by mouth daily.   No facility-administered encounter medications on file as of 08/28/2020.   ALLERGIES: No Known Allergies VACCINATION STATUS: Immunization History  Administered Date(s) Administered  . Moderna SARS-COVID-2 Vaccination 04/05/2020, 05/03/2020    Diabetes She presents for her follow-up diabetic visit. She has type 2 diabetes mellitus. Onset time: She was diagnosed at approximate age of 49 years after episodes of gestational  diabetes with 3 of her pregnancies from 2000 until 2014. Her disease course has been stable. There are no hypoglycemic associated symptoms. Pertinent negatives for hypoglycemia include no confusion, headaches, pallor, sweats or tremors. Pertinent negatives for diabetes include no chest pain, no fatigue, no polydipsia, no polyphagia and no polyuria. There are no hypoglycemic complications. Symptoms are stable. There are no diabetic complications. Risk factors for coronary artery disease include diabetes mellitus, hypertension, obesity, sedentary lifestyle and dyslipidemia. Current diabetic treatment includes oral agent (dual therapy). She is compliant with treatment most of the time. Her weight is stable. She is following a  diabetic diet. When asked about meal planning, she reported none. She has not had a previous visit with a dietitian. She participates in exercise intermittently. Her home blood glucose trend is decreasing steadily. Her breakfast blood glucose range is generally 140-180 mg/dl. (She presents today with her meter, no logs, showing above target fasting glycemic profile.  Her POCT A1c today is 7.4%, increasing from last visit of 6.8%.  She reports she "has fallen off the bandwagon" lately in regards to exercise and her diet.  There are some mild, random hypoglycemia noted (which may be associated with meal timing or meal quantity).) An ACE inhibitor/angiotensin II receptor blocker is being taken. She does not see a podiatrist.Eye exam is current.  Hypertension This is a chronic problem. The current episode started more than 1 year ago. The problem has been gradually improving since onset. The problem is controlled. Pertinent negatives include no chest pain, headaches, palpitations, shortness of breath or sweats. There are no associated agents to hypertension. Risk factors for coronary artery disease include diabetes mellitus, obesity, sedentary lifestyle and dyslipidemia. Past treatments include ACE inhibitors. The current treatment provides moderate improvement. There are no compliance problems.  Identifiable causes of hypertension include chronic renal disease.  Hyperlipidemia This is a chronic problem. The current episode started more than 1 year ago. The problem is controlled. Recent lipid tests were reviewed and are normal. Exacerbating diseases include chronic renal disease, diabetes and obesity. There are no known factors aggravating her hyperlipidemia. Pertinent negatives include no chest pain, myalgias or shortness of breath. Current antihyperlipidemic treatment includes statins. The current treatment provides moderate improvement of lipids. There are no compliance problems.  Risk factors for coronary artery  disease include diabetes mellitus, dyslipidemia, hypertension, obesity and a sedentary lifestyle.   Review of systems  Constitutional: + Minimally fluctuating body weight,  current There is no height or weight on file to calculate BMI. , no fatigue, no subjective hyperthermia, no subjective hypothermia Eyes: no blurry vision, no xerophthalmia ENT: no sore throat, no nodules palpated in throat, no dysphagia/odynophagia, no hoarseness Cardiovascular: no chest pain, no shortness of breath, no palpitations, no leg swelling Respiratory: no cough, no shortness of breath Gastrointestinal: no nausea/vomiting/diarrhea Musculoskeletal: no muscle/joint aches Skin: no rashes, no hyperemia Neurological: no tremors, no numbness, no tingling, no dizziness Psychiatric: no depression, no anxiety   Objective:    BP 132/82 (BP Location: Right Arm)   Pulse 94   Wt Readings from Last 3 Encounters:  04/26/20 (!) 249 lb 6.4 oz (113.1 kg)  10/25/19 261 lb 9.6 oz (118.7 kg)  07/23/18 232 lb (105.2 kg)    BP Readings from Last 3 Encounters:  08/28/20 132/82  04/26/20 113/76  10/25/19 137/85     Physical Exam- Limited  Constitutional:  There is no height or weight on file to calculate BMI. , not in acute distress, normal state of mind  Eyes:  EOMI, no exophthalmos Neck: Supple Thyroid: No gross goiter Cardiovascular: RRR, no murmers, rubs, or gallops, no edema Respiratory: Adequate breathing efforts, no crackles, rales, rhonchi, or wheezing Musculoskeletal: no gross deformities, strength intact in all four extremities, no gross restriction of joint movements Skin:  no rashes, no hyperemia Neurological: no tremor with outstretched hands   POCT ABI Results 08/28/20   Right ABI:  1.10      Left ABI:  1.07  Right leg systolic / diastolic: 149/77 mmHg Left leg systolic / diastolic: 145/73 mmHg  Arm systolic / diastolic: 135/83 mmHG  Detailed report will be scanned into patient chart.   Recent  Results (from the past 2160 hour(s))  HgB A1c     Status: Abnormal   Collection Time: 08/28/20  3:27 PM  Result Value Ref Range   Hemoglobin A1C     HbA1c POC (<> result, manual entry)     HbA1c, POC (prediabetic range)     HbA1c, POC (controlled diabetic range) 7.4 (A) 0.0 - 7.0 %  Lipid Panel     Component Value Date/Time   CHOL 150 07/14/2018 0000   TRIG 85 07/14/2018 0000   HDL 50 07/14/2018 0000   LDLCALC 83 07/14/2018 0000      Assessment & Plan:   1) Uncontrolled type 2 diabetes mellitus with complication, without long-term current use of insulin (HCC)  - Patient has currently uncontrolled symptomatic type 2 DM since 49 years of age.  She presents today with her meter, no logs, showing above target fasting glycemic profile.  Her POCT A1c today is 7.4%, increasing from last visit of 6.8%.  She reports she "has fallen off the bandwagon" lately in regards to exercise and her diet.  There are some mild, random hypoglycemia noted (which may be associated with meal timing or meal quantity).  -I reviewed her recent labs which include normal renal function.     Her diabetes is complicated by obesity/sedentary life and patient remains at a high risk for more acute and chronic complications of diabetes which include CAD, CVA, CKD, retinopathy, and neuropathy. These are all discussed in detail with the patient.  - Nutritional counseling repeated at each appointment due to patients tendency to fall back in to old habits.  - The patient admits there is a room for improvement in their diet and drink choices. -  Suggestion is made for the patient to avoid simple carbohydrates from their diet including Cakes, Sweet Desserts / Pastries, Ice Cream, Soda (diet and regular), Sweet Tea, Candies, Chips, Cookies, Sweet Pastries,  Store Bought Juices, Alcohol in Excess of  1-2 drinks a day, Artificial Sweeteners, Coffee Creamer, and "Sugar-free" Products. This will help patient to have stable blood  glucose profile and potentially avoid unintended weight gain.   - I encouraged the patient to switch to  unprocessed or minimally processed complex starch and increased protein intake (animal or plant source), fruits, and vegetables.   - Patient is advised to stick to a routine mealtimes to eat 3 meals  a day and avoid unnecessary snacks ( to snack only to correct hypoglycemia).  -She is agreeable to seeing Norm Salt, RDE for a diabetes refresher.  - I have approached patient with the following individualized plan to manage diabetes and patient agrees:   -She does not need insulin treatment for now.  However, if she does not regain control of her diabetes, basal insulin is the next step.  -She is advised to continue Ozempic 1  mg weekly, Metformin 1000 mg twice daily with meals, glipizide XL 5 mg daily with breakfast.  -She is encouraged to continue monitoring blood glucose at least once daily, before breakfast and as needed, and to call the clinic if she has readings less than 70 or greater than 200 for 3 tests in a row.  She promises to do better with diet and exercise.  2) BP/HTN: Her blood pressure is controlled to target.  She is advised to continue Lisinopril 20 mg p.o. daily.   3) Lipids/HPL:  Her most recent lipid panel from 07/14/18 shows controlled LDL of 83.  She is advised to continue Atorvastatin 20 mg po daily at bedtime.  Side effects and precautions discussed with her.  Will recheck lipid panel prior to next visit.  4)  Weight/Diet:  Her There is no height or weight on file to calculate BMI.-- clearly complicating her DM management.  She is a candidate for modest weight loss.  CDE Consult in progress, exercise, and detailed carbohydrates information provided.   5) Chronic Care/Health Maintenance: -Patient is on ACEI and statin encouraged to continue to follow up with Ophthalmology, Podiatrist at least yearly or according to recommendations, and advised to   stay away  from smoking. I have recommended yearly flu vaccine and pneumonia vaccination at least every 5 years; moderate intensity exercise for up to 150 minutes weekly; and  sleep for at least 7 hours a day.  - I advised patient to maintain close follow up with for primary care needs.  - Time spent on this patient care encounter:  35 min, of which > 50% was spent in  counseling and the rest reviewing her blood glucose logs , discussing her hypoglycemia and hyperglycemia episodes, reviewing her current and  previous labs / studies  ( including abstraction from other facilities) and medications  doses and developing a  long term treatment plan and documenting her care.   Please refer to Patient Instructions for Blood Glucose Monitoring and Insulin/Medications Dosing Guide"  in media tab for additional information. Please  also refer to " Patient Self Inventory" in the Media  tab for reviewed elements of pertinent patient history.  Stacy Clarke participated in the discussions, expressed understanding, and voiced agreement with the above plans.  All questions were answered to her satisfaction. she is encouraged to contact clinic should she have any questions or concerns prior to her return visit.  Follow up plan: - Return in about 3 months (around 11/27/2020) for Diabetes follow up with A1c in office, Bring glucometer and logs, Previsit labs.  Ronny Bacon, [facility] [address] [place], [place] [address] Phone: [phone] Fax: [phone]  08/28/2020, 3:45 PM

## 2020-08-28 NOTE — Patient Instructions (Signed)

## 2020-10-24 ENCOUNTER — Other Ambulatory Visit: Payer: Self-pay | Admitting: "Endocrinology

## 2020-10-31 ENCOUNTER — Encounter: Payer: 59 | Attending: Nurse Practitioner | Admitting: Nutrition

## 2020-10-31 ENCOUNTER — Ambulatory Visit: Payer: 59 | Admitting: Nutrition

## 2020-10-31 DIAGNOSIS — E782 Mixed hyperlipidemia: Secondary | ICD-10-CM | POA: Insufficient documentation

## 2020-10-31 DIAGNOSIS — E118 Type 2 diabetes mellitus with unspecified complications: Secondary | ICD-10-CM | POA: Diagnosis present

## 2020-10-31 DIAGNOSIS — IMO0002 Reserved for concepts with insufficient information to code with codable children: Secondary | ICD-10-CM

## 2020-10-31 DIAGNOSIS — I10 Essential (primary) hypertension: Secondary | ICD-10-CM | POA: Insufficient documentation

## 2020-10-31 DIAGNOSIS — E1165 Type 2 diabetes mellitus with hyperglycemia: Secondary | ICD-10-CM | POA: Insufficient documentation

## 2020-10-31 NOTE — Patient Instructions (Signed)
Goals set by patient  Start meal prepping weekly. Going back to the gym tomorrow- 3 times per week. Drinking 100 oz of water. Cut out diet green tea Increase low carb vegetables Keep a food journal  Get A1C down to 6.8% or less.

## 2020-10-31 NOTE — Progress Notes (Signed)
  Medical Nutrition Therapy:  Appt start time: 1530 end time:  1630.  Assessment:  Primary concerns today: Diabetes Type 2 Has been trying to get back on portions and eating more fresh fruits and vegetables. Drinking more water. Still has issue eating late at night due to work schedule and skipping meals.  Diet is inconsistent with meals and timing of meals. Trying to do lower carb diet an that is effecting her blood sugars. Diet is higher in salt. FBS 118-170's mg/dl  Bedtime: Last R4B 6.3%.  Lab Results  Component Value Date   HGBA1C 7.4 (A) 08/28/2020   Preferred Learning Style:   No preference indicated   Learning Readiness:   Ready  Change in progress   MEDICATIONS: Metformin 1000 mg BID  DIETARY INTAKE:   24-hr recall:  B ( AM):1/2 blueberry bagel, eggs 2    BS 137 mg/dl,  [place] Diet Green Tea Lunch: Prevalone cheese 2, salami, ow carb tortilla - Diet Green Tea Dinner:  Skipped,   Usual physical activity: going to work out at [employer] energy needs: 1400  calories 135 g carbohydrates 90 g protein 44 g fat  Progress Towards Goal(s):  In progress.   Nutritional Diagnosis:  NB-1.1 Food and nutrition-related knowledge deficit As related to Diabetes Type 2.  As evidenced by A1C 7.4.    Intervention:  Nutrition and Diabetes education provided on My Plate, CHO counting, meal planning, portion sizes, timing of meals, avoiding snacks between meals unless having a low blood sugar, target ranges for A1C and blood sugars, signs/symptoms and treatment of hyper/hypoglycemia, monitoring blood sugars, taking medications as prescribed, benefits of exercising 30 minutes per day and prevention of complications of DM.  Goals set by patient  Start meal prepping weekly. Going back to the gym tomorrow- 3 times per week. Drinking 100 oz of water. Cut out diet green tea Increase low carb vegetables Keep a food journal  Get A1C down to 6.5% or less.   Teaching Method  Utilized:   Visual Auditory Hands on  Handouts given during visit include:  Emailed My Plate, Diabetes Instructions, Carb counting, weight loss tips and high fiber diet, low sodium diet.  Barriers to learning/adherence to lifestyle change: none  Demonstrated degree of understanding via:  Teach Back   Monitoring/Evaluation:  Dietary intake, exercise, blood sugars , and body weight in 1 month(s).

## 2020-11-21 LAB — LIPID PANEL
Cholesterol: 201 — AB (ref 0–200)
LDL Cholesterol: 123
Triglycerides: 131 (ref 40–160)

## 2020-11-21 LAB — BASIC METABOLIC PANEL
BUN: 20 (ref 4–21)
Creatinine: 0.7 (ref 0.5–1.1)
Glucose: 111
Potassium: 4.2 (ref 3.4–5.3)
Sodium: 139 (ref 137–147)

## 2020-11-21 LAB — COMPREHENSIVE METABOLIC PANEL
Calcium: 9.2 (ref 8.7–10.7)
GFR calc non Af Amer: 96

## 2020-11-23 ENCOUNTER — Encounter: Payer: Self-pay | Admitting: Nutrition

## 2020-11-28 ENCOUNTER — Other Ambulatory Visit: Payer: Self-pay

## 2020-11-28 ENCOUNTER — Encounter: Payer: Self-pay | Admitting: Nurse Practitioner

## 2020-11-28 ENCOUNTER — Encounter: Payer: Self-pay | Admitting: Nutrition

## 2020-11-28 ENCOUNTER — Ambulatory Visit (INDEPENDENT_AMBULATORY_CARE_PROVIDER_SITE_OTHER): Payer: 59 | Admitting: Nurse Practitioner

## 2020-11-28 ENCOUNTER — Encounter: Payer: 59 | Attending: Nurse Practitioner | Admitting: Nutrition

## 2020-11-28 VITALS — Ht 66.0 in | Wt 249.0 lb

## 2020-11-28 VITALS — BP 111/80 | HR 91 | Ht 66.0 in | Wt 249.0 lb

## 2020-11-28 DIAGNOSIS — IMO0002 Reserved for concepts with insufficient information to code with codable children: Secondary | ICD-10-CM

## 2020-11-28 DIAGNOSIS — E782 Mixed hyperlipidemia: Secondary | ICD-10-CM

## 2020-11-28 DIAGNOSIS — I10 Essential (primary) hypertension: Secondary | ICD-10-CM | POA: Insufficient documentation

## 2020-11-28 DIAGNOSIS — E1165 Type 2 diabetes mellitus with hyperglycemia: Secondary | ICD-10-CM | POA: Diagnosis not present

## 2020-11-28 DIAGNOSIS — E118 Type 2 diabetes mellitus with unspecified complications: Secondary | ICD-10-CM | POA: Diagnosis present

## 2020-11-28 LAB — POCT GLYCOSYLATED HEMOGLOBIN (HGB A1C): HbA1c, POC (controlled diabetic range): 7.1 % — AB (ref 0.0–7.0)

## 2020-11-28 NOTE — Progress Notes (Signed)
  Medical Nutrition Therapy:  Appt start time: 1610 end time:  1545  Assessment:  Primary concerns today: Diabetes Type 2 Saw Rayetta Pigg, FNP today at [facility] Last A1C 7.1% Had COVID a few weeks ago. Changed: Is drinking more water and eating more low carb vegetables. Cut out diet green tea A1C down from 7.4  To 7.1%.  Lab Results  Component Value Date   HGBA1C 7.1 (A) 11/28/2020   Preferred Learning Style:   No preference indicated   Learning Readiness:   Ready  Change in progress   MEDICATIONS: Metformin 1000 mg BID  DIETARY INTAKE:  24-hr recall:  B ( AM): 1/2 Blueberry bagel and eggs,     BS 137 mg/dl, L)  Mac/cheese, Dinner:  Grilled pork chops and green beans, Water  Usual physical activity: Willing to start back at the gym.  Estimated energy needs: 1400  calories 135 g carbohydrates 90 g protein 44 g fat  Progress Towards Goal(s):  In progress.   Nutritional Diagnosis:  NB-1.1 Food and nutrition-related knowledge deficit As related to Diabetes Type 2.  As evidenced by A1C 7.4.    Intervention: Weight loss tips, meal timing and getting in 60 minutes 2-3 times a week of exercise or 10,000 steps daily.  Goals set by patient  Start meal prepping weekly. Going back to the gym tomorrow- 3 times per week. Drinking 100 oz of water. Increase low carb vegetables Keep a food journal-still in progress Get A1C down to 6.5% or less.  Lose 1 lb per week.   Teaching Method Utilized:   Visual Auditory Hands on  Handouts given during visit include:  Emailed My Plate, Diabetes Instructions, Carb counting, weight loss tips and high fiber diet, low sodium diet.  Barriers to learning/adherence to lifestyle change: none  Demonstrated degree of understanding via:  Teach Back   Monitoring/Evaluation:  Dietary intake, exercise, blood sugars , and body weight in 3-4  month(s).

## 2020-11-28 NOTE — Patient Instructions (Signed)
Goals set by patient  Start meal prepping weekly. Going back to the gym tomorrow- 3 times per week. Drinking 100 oz of water. Increase low carb vegetables Keep a food journal-still in progress Get A1C down to 6.5% or less.  Lose 1 lb per week.

## 2020-11-28 NOTE — Patient Instructions (Signed)

## 2020-11-28 NOTE — Progress Notes (Signed)
11/28/2020  Endocrinology follow-up note   Subjective:    Patient ID: Stacy Clarke, female    DOB: 1971/02/25. Patient is being seen in follow-up for management of type 2  Diabetes, HTN requested by  Joaquin Courts, DO  Past Medical History:  Diagnosis Date  . HTN (hypertension) 08/28/2012    Social History   Socioeconomic History  . Marital status: Married    Spouse name: Not on file  . Number of children: Not on file  . Years of education: Not on file  . Highest education level: Not on file  Occupational History  . Not on file  Tobacco Use  . Smoking status: Former Games developer  . Smokeless tobacco: Never Used  Vaping Use  . Vaping Use: Never used  Substance and Sexual Activity  . Alcohol use: No  . Drug use: No  . Sexual activity: Not on file  Other Topics Concern  . Not on file  Social History Narrative  . Not on file   Social Determinants of Health   Financial Resource Strain: Not on file  Food Insecurity: Not on file  Transportation Needs: Not on file  Physical Activity: Not on file  Stress: Not on file  Social Connections: Not on file   Outpatient Encounter Medications as of 11/28/2020  Medication Sig  . aspirin 81 MG EC tablet Take 81 mg by mouth daily. Swallow whole.  Marland Kitchen atorvastatin (LIPITOR) 20 MG tablet Take 1 tablet (20 mg total) by mouth daily.  . cetirizine (ZYRTEC) 10 MG tablet Take by mouth.  . cholecalciferol (VITAMIN D3) 25 MCG (1000 UNIT) tablet Take 5,000 Units by mouth daily.  . famotidine (PEPCID) 20 MG tablet Take by mouth.  Marland Kitchen glipiZIDE (GLUCOTROL XL) 5 MG 24 hr tablet TAKE 1 TABLET BY MOUTH EVERY DAY WITH BREAKFAST  . lisinopril (PRINIVIL,ZESTRIL) 20 MG tablet Take 20 mg by mouth daily.  . metFORMIN (GLUCOPHAGE) 500 MG tablet Take 2 tablets (1,000 mg total) by mouth 2 (two) times daily after a meal.  . ONETOUCH VERIO test strip USE AS INSTRUCTED TO TEST BLOOD GLUCOSE TWO TIMES DAILY  . OZEMPIC, 1 MG/DOSE, 2 MG/1.5ML SOPN  INJECT 1 MG SUBCUTANEOUSLY ONCE A WEEK  . PARoxetine (PAXIL) 20 MG tablet Take 20 mg by mouth daily.   No facility-administered encounter medications on file as of 11/28/2020.   ALLERGIES: No Known Allergies VACCINATION STATUS: Immunization History  Administered Date(s) Administered  . Moderna Sars-Covid-2 Vaccination 04/05/2020, 05/03/2020    Diabetes She presents for her follow-up diabetic visit. She has type 2 diabetes mellitus. Onset time: She was diagnosed at approximate age of 50 years after episodes of gestational diabetes with 3 of her pregnancies from 20 until 2014. Her disease course has been improving. There are no hypoglycemic associated symptoms. Pertinent negatives for hypoglycemia include no confusion, headaches, pallor, sweats or tremors. Pertinent negatives for diabetes include no chest pain, no fatigue, no polydipsia, no polyphagia and no polyuria. There are no hypoglycemic complications. Symptoms are stable. There are no diabetic complications. Risk factors for coronary artery disease include diabetes mellitus, hypertension, obesity, sedentary lifestyle and dyslipidemia. Current diabetic treatment includes oral agent (dual therapy). She is compliant with treatment most of the time. Her weight is stable. She is following a diabetic diet. When asked about meal planning, she reported none. She has not had a previous visit with a dietitian. She participates in exercise intermittently. Her home blood glucose trend  is decreasing steadily. Her overall blood glucose range is 130-140 mg/dl. (She presents today with her meter, no logs, showing improved yet still slightly above target glycemic profile overall.  Her POCT A1c today is 7.1%, improving from last visit of 7.4%.  There are no episodes of hypoglycemia reported or documented.) An ACE inhibitor/angiotensin II receptor blocker is being taken. She does not see a podiatrist.Eye exam is current.  Hypertension This is a chronic problem.  The current episode started more than 1 year ago. The problem has been gradually improving since onset. The problem is controlled. Pertinent negatives include no chest pain, headaches, palpitations, shortness of breath or sweats. There are no associated agents to hypertension. Risk factors for coronary artery disease include diabetes mellitus, obesity, sedentary lifestyle and dyslipidemia. Past treatments include ACE inhibitors. The current treatment provides moderate improvement. There are no compliance problems.  Identifiable causes of hypertension include chronic renal disease.  Hyperlipidemia This is a chronic problem. The current episode started more than 1 year ago. The problem is uncontrolled. Recent lipid tests were reviewed and are high. Exacerbating diseases include chronic renal disease, diabetes and obesity. There are no known factors aggravating her hyperlipidemia. Pertinent negatives include no chest pain, myalgias or shortness of breath. Current antihyperlipidemic treatment includes statins. The current treatment provides moderate improvement of lipids. Compliance problems: sometimes forgets to take her cholesterol pill.  Risk factors for coronary artery disease include diabetes mellitus, dyslipidemia, hypertension, obesity and a sedentary lifestyle.   Review of systems  Constitutional: + Minimally fluctuating body weight,  current Body mass index is 40.19 kg/m. , no fatigue, no subjective hyperthermia, no subjective hypothermia Eyes: no blurry vision, no xerophthalmia ENT: no sore throat, no nodules palpated in throat, no dysphagia/odynophagia, no hoarseness Cardiovascular: no chest pain, no shortness of breath, no palpitations, no leg swelling Respiratory: no cough, no shortness of breath Gastrointestinal: no nausea/vomiting/diarrhea Musculoskeletal: no muscle/joint aches Skin: no rashes, no hyperemia Neurological: no tremors, no numbness, no tingling, no dizziness Psychiatric: no  depression, no anxiety   Objective:    BP 111/80 (BP Location: Left Arm, Patient Position: Sitting)   Pulse 91   Ht 5\' 6"  (1.676 m)   Wt 249 lb (112.9 kg)   BMI 40.19 kg/m   Wt Readings from Last 3 Encounters:  11/28/20 249 lb (112.9 kg)  10/31/20 250 lb (113.4 kg)  04/26/20 (!) 249 lb 6.4 oz (113.1 kg)    BP Readings from Last 3 Encounters:  11/28/20 111/80  08/28/20 132/82  04/26/20 113/76     Physical Exam- Limited  Constitutional:  Body mass index is 40.19 kg/m. , not in acute distress, normal state of mind Eyes:  EOMI, no exophthalmos Neck: Supple Cardiovascular: RRR, no murmers, rubs, or gallops, no edema Respiratory: Adequate breathing efforts, no crackles, rales, rhonchi, or wheezing Musculoskeletal: no gross deformities, strength intact in all four extremities, no gross restriction of joint movements Skin:  no rashes, no hyperemia Neurological: no tremor with outstretched hands   Recent Results (from the past 2160 hour(s))  Basic metabolic panel     Status: None   Collection Time: 11/21/20 12:00 AM  Result Value Ref Range   Glucose 111    BUN 20 4 - 21   Creatinine 0.7 0.5 - 1.1   Potassium 4.2 3.4 - 5.3   Sodium 139 137 - 147  Comprehensive metabolic panel     Status: None   Collection Time: 11/21/20 12:00 AM  Result Value Ref Range  GFR calc non Af Amer 96    Calcium 9.2 8.7 - 10.7  Lipid panel     Status: Abnormal   Collection Time: 11/21/20 12:00 AM  Result Value Ref Range   Triglycerides 131 40 - 160   Cholesterol 201 (A) 0 - 200   LDL Cholesterol 123   HgB P5K     Status: Abnormal   Collection Time: 11/28/20  2:48 PM  Result Value Ref Range   Hemoglobin A1C     HbA1c POC (<> result, manual entry)     HbA1c, POC (prediabetic range)     HbA1c, POC (controlled diabetic range) 7.1 (A) 0.0 - 7.0 %  Lipid Panel     Component Value Date/Time   CHOL 201 (A) 11/21/2020 0000   TRIG 131 11/21/2020 0000   HDL 50 07/14/2018 0000   LDLCALC 123  11/21/2020 0000      Assessment & Plan:   1) Uncontrolled type 2 diabetes mellitus with complication, without long-term current use of insulin (HCC)  - Patient has currently uncontrolled symptomatic type 2 DM since 50 years of age.  She presents today with her meter, no logs, showing improved yet still slightly above target glycemic profile overall.  Her POCT A1c today is 7.1%, improving from last visit of 7.4%.  There are no episodes of hypoglycemia reported or documented.  -I reviewed her recent labs which include normal renal function.     Her diabetes is complicated by obesity/sedentary life and patient remains at a high risk for more acute and chronic complications of diabetes which include CAD, CVA, CKD, retinopathy, and neuropathy. These are all discussed in detail with the patient.  - Nutritional counseling repeated at each appointment due to patients tendency to fall back in to old habits.  - The patient admits there is a room for improvement in their diet and drink choices. -  Suggestion is made for the patient to avoid simple carbohydrates from their diet including Cakes, Sweet Desserts / Pastries, Ice Cream, Soda (diet and regular), Sweet Tea, Candies, Chips, Cookies, Sweet Pastries,  Store Bought Juices, Alcohol in Excess of  1-2 drinks a day, Artificial Sweeteners, Coffee Creamer, and "Sugar-free" Products. This will help patient to have stable blood glucose profile and potentially avoid unintended weight gain.   - I encouraged the patient to switch to  unprocessed or minimally processed complex starch and increased protein intake (animal or plant source), fruits, and vegetables.   - Patient is advised to stick to a routine mealtimes to eat 3 meals  a day and avoid unnecessary snacks ( to snack only to correct hypoglycemia).  -She is agreeable to seeing Norm Salt, RDE for a diabetes refresher, sees her today after her visit with me.  - I have approached patient with the  following individualized plan to manage diabetes and patient agrees:   -Given her recent control, she does not need insulin treatment for now.  However, if she loses control of her diabetes, basal insulin is the next step.  -She is advised to continue Ozempic 1 mg weekly, Metformin 1000 mg twice daily with meals, glipizide XL 5 mg daily with breakfast.  -She is encouraged to continue monitoring blood glucose at least once daily, before breakfast and as needed, and to call the clinic if she has readings less than 70 or greater than 200 for 3 tests in a row.  She promises to do better with diet and exercise.  2) BP/HTN: Her blood pressure is  controlled to target.  She is advised to continue Lisinopril 20 mg p.o. daily.   3) Lipids/HPL:  Her most recent lipid panel from 11/21/20 shows uncontrolled LDL of 123.  She reports she sometimes forgets to take her cholesterol pill.  She is advised to continue Atorvastatin 20 mg po daily at bedtime and be consistent with taking it.  Side effects and precautions discussed with her.  4)  Weight/Diet:  Her Body mass index is 40.19 kg/m.-- clearly complicating her DM management.  She is a candidate for modest weight loss.  CDE Consult in progress, exercise, and detailed carbohydrates information provided.   5) Chronic Care/Health Maintenance: -Patient is on ACEI and statin encouraged to continue to follow up with Ophthalmology, Podiatrist at least yearly or according to recommendations, and advised to stay away from smoking. I have recommended yearly flu vaccine and pneumonia vaccination at least every 5 years; moderate intensity exercise for up to 150 minutes weekly; and  sleep for at least 7 hours a day.  - I advised patient to maintain close follow up with for primary care needs.  - Time spent on this patient care encounter:  40 min, of which > 50% was spent in  counseling and the rest reviewing her blood glucose logs , discussing her hypoglycemia and  hyperglycemia episodes, reviewing her current and  previous labs / studies  ( including abstraction from other facilities) and medications  doses and developing a  long term treatment plan and documenting her care.   Please refer to Patient Instructions for Blood Glucose Monitoring and Insulin/Medications Dosing Guide"  in media tab for additional information. Please  also refer to " Patient Self Inventory" in the Media  tab for reviewed elements of pertinent patient history.  Sheva Vanderburg participated in the discussions, expressed understanding, and voiced agreement with the above plans.  All questions were answered to her satisfaction. she is encouraged to contact clinic should she have any questions or concerns prior to her return visit.  Follow up plan: - Return in about 4 months (around 03/30/2021) for Diabetes follow up- A1c and urine micro in office, No previsit labs, Bring glucometer and logs.  Ronny Bacon, [facility] [address] [place], [place] [address] Phone: [phone] Fax: [phone]  11/28/2020, 2:57 PM

## 2020-12-28 ENCOUNTER — Encounter: Payer: Self-pay | Admitting: Nutrition

## 2021-01-19 ENCOUNTER — Other Ambulatory Visit: Payer: Self-pay | Admitting: Nurse Practitioner

## 2021-03-20 ENCOUNTER — Telehealth: Payer: Self-pay | Admitting: Nurse Practitioner

## 2021-03-20 MED ORDER — OZEMPIC (1 MG/DOSE) 2 MG/1.5ML ~~LOC~~ SOPN: PEN_INJECTOR | SUBCUTANEOUS | 1 refills | Status: DC

## 2021-03-20 NOTE — Addendum Note (Signed)
Addended by: Derrell Lolling on: 03/20/2021 04:37 PM   Modules accepted: Orders

## 2021-03-20 NOTE — Telephone Encounter (Signed)
Rx sent 

## 2021-03-20 NOTE — Telephone Encounter (Signed)
Pt is requesting a refill. OZEMPIC, 1 MG/DOSE, 2 MG/1.5ML SOPN   [id number] - [place], [place] - [address] Phone:  [phone]  Fax:  [phone]

## 2021-04-03 ENCOUNTER — Ambulatory Visit: Payer: 59 | Admitting: Nurse Practitioner

## 2021-04-17 ENCOUNTER — Other Ambulatory Visit: Payer: Self-pay | Admitting: Nurse Practitioner

## 2021-05-09 NOTE — Patient Instructions (Signed)

## 2021-05-10 ENCOUNTER — Encounter: Payer: Self-pay | Admitting: Nurse Practitioner

## 2021-05-10 ENCOUNTER — Ambulatory Visit (INDEPENDENT_AMBULATORY_CARE_PROVIDER_SITE_OTHER): Payer: 59 | Admitting: Nurse Practitioner

## 2021-05-10 ENCOUNTER — Other Ambulatory Visit: Payer: Self-pay

## 2021-05-10 VITALS — BP 138/81 | HR 94 | Ht 66.0 in | Wt 254.0 lb

## 2021-05-10 DIAGNOSIS — I10 Essential (primary) hypertension: Secondary | ICD-10-CM

## 2021-05-10 DIAGNOSIS — E782 Mixed hyperlipidemia: Secondary | ICD-10-CM

## 2021-05-10 DIAGNOSIS — E118 Type 2 diabetes mellitus with unspecified complications: Secondary | ICD-10-CM

## 2021-05-10 DIAGNOSIS — IMO0002 Reserved for concepts with insufficient information to code with codable children: Secondary | ICD-10-CM

## 2021-05-10 DIAGNOSIS — E1165 Type 2 diabetes mellitus with hyperglycemia: Secondary | ICD-10-CM | POA: Diagnosis not present

## 2021-05-10 LAB — POCT UA - MICROALBUMIN
Creatinine, POC: 50 mg/dL
Microalbumin Ur, POC: 10 mg/L

## 2021-05-10 LAB — POCT GLYCOSYLATED HEMOGLOBIN (HGB A1C): Hemoglobin A1C: 7.7 % — AB (ref 4.0–5.6)

## 2021-05-10 NOTE — Progress Notes (Signed)
05/10/2021  Endocrinology follow-up note   Subjective:    Patient ID: Stacy Clarke, female    DOB: 1971-06-04. Patient is being seen in follow-up for management of type 2  Diabetes, HTN requested by  Jacqualine Code, DO  Past Medical History:  Diagnosis Date   HTN (hypertension) 08/28/2012    Social History   Socioeconomic History   Marital status: Married    Spouse name: Not on file   Number of children: Not on file   Years of education: Not on file   Highest education level: Not on file  Occupational History   Not on file  Tobacco Use   Smoking status: Former   Smokeless tobacco: Never  Vaping Use   Vaping Use: Never used  Substance and Sexual Activity   Alcohol use: No   Drug use: No   Sexual activity: Not on file  Other Topics Concern   Not on file  Social History Narrative   Not on file   Social Determinants of Health   Financial Resource Strain: Not on file  Food Insecurity: Not on file  Transportation Needs: Not on file  Physical Activity: Not on file  Stress: Not on file  Social Connections: Not on file   Outpatient Encounter Medications as of 05/10/2021  Medication Sig   triamcinolone cream (KENALOG) 0.1 % Apply 1 application topically at bedtime.   aspirin 81 MG EC tablet Take 81 mg by mouth daily. Swallow whole.   atorvastatin (LIPITOR) 20 MG tablet Take 1 tablet (20 mg total) by mouth daily.   cholecalciferol (VITAMIN D3) 25 MCG (1000 UNIT) tablet Take 5,000 Units by mouth daily.   clobetasol ointment (TEMOVATE) 0.05 % Apply topically at bedtime.   famotidine (PEPCID) 20 MG tablet Take by mouth.   folic acid (FOLVITE) 1 MG tablet Take 1 mg by mouth daily.   glipiZIDE (GLUCOTROL XL) 5 MG 24 hr tablet TAKE 1 TABLET BY MOUTH EVERY DAY WITH BREAKFAST   lisinopril (PRINIVIL,ZESTRIL) 20 MG tablet Take 20 mg by mouth daily.   metFORMIN (GLUCOPHAGE) 500 MG tablet Take 2 tablets (1,000 mg total) by mouth 2 (two) times daily after a meal.    methotrexate (RHEUMATREX) 2.5 MG tablet once a week. 7 tablets weekly   ONETOUCH VERIO test strip USE AS INSTRUCTED TO TEST BLOOD GLUCOSE TWO TIMES DAILY   PARoxetine (PAXIL) 20 MG tablet Take 20 mg by mouth daily.   Semaglutide, 1 MG/DOSE, (OZEMPIC, 1 MG/DOSE,) 2 MG/1.5ML SOPN INJECT 1 MG SUBCUTANEOUSLY ONCE A WEEK   [DISCONTINUED] cetirizine (ZYRTEC) 10 MG tablet Take by mouth.   No facility-administered encounter medications on file as of 05/10/2021.   ALLERGIES: No Known Allergies VACCINATION STATUS: Immunization History  Administered Date(s) Administered   Moderna Sars-Covid-2 Vaccination 04/05/2020, 05/03/2020    Diabetes She presents for her follow-up diabetic visit. She has type 2 diabetes mellitus. Onset time: She was diagnosed at approximate age of 11 years after episodes of gestational diabetes with 3 of her pregnancies from 55 until 2014. Her disease course has been worsening. There are no hypoglycemic associated symptoms. Pertinent negatives for hypoglycemia include no confusion, headaches, pallor, sweats or tremors. Pertinent negatives for diabetes include no chest pain, no fatigue, no polydipsia, no polyphagia, no polyuria and no weight loss. There are no hypoglycemic complications. Symptoms are stable. There are no diabetic complications. Risk factors for coronary artery disease include diabetes mellitus, hypertension, obesity, sedentary lifestyle and  dyslipidemia. Current diabetic treatment includes oral agent (dual therapy) (and ozempic). She is compliant with treatment most of the time. Her weight is stable. She is following a diabetic diet. When asked about meal planning, she reported none. She has not had a previous visit with a dietitian. She participates in exercise intermittently. Her home blood glucose trend is decreasing steadily. Her breakfast blood glucose range is generally 110-130 mg/dl. Her overall blood glucose range is 110-130 mg/dl. (She presents today with her  meter, no logs, showing improved fasting glycemic profile over the last few weeks.  Her POCT A1c today is 7.7%, increasing slightly from last visit of 7.1%.  She has been on multiple rounds of steroids recently for flare of psoriasis on her hands and feet (she is seeing Dermatology for this). She also had a peroid where she didn't take her Ozempic due to cost, but now that she has met her deductible, she has began taking it again and it shows in her glucose readings. She denies any hypoglycemia.) An ACE inhibitor/angiotensin II receptor blocker is being taken. She does not see a podiatrist.Eye exam is current.  Hypertension This is a chronic problem. The current episode started more than 1 year ago. The problem has been gradually improving since onset. The problem is controlled. Pertinent negatives include no chest pain, headaches, palpitations, shortness of breath or sweats. There are no associated agents to hypertension. Risk factors for coronary artery disease include diabetes mellitus, obesity, sedentary lifestyle and dyslipidemia. Past treatments include ACE inhibitors. The current treatment provides moderate improvement. There are no compliance problems.  Identifiable causes of hypertension include chronic renal disease.  Hyperlipidemia This is a chronic problem. The current episode started more than 1 year ago. The problem is uncontrolled. Recent lipid tests were reviewed and are high. Exacerbating diseases include chronic renal disease, diabetes and obesity. There are no known factors aggravating her hyperlipidemia. Pertinent negatives include no chest pain, myalgias or shortness of breath. Current antihyperlipidemic treatment includes statins. The current treatment provides moderate improvement of lipids. Compliance problems: sometimes forgets to take her cholesterol pill.  Risk factors for coronary artery disease include diabetes mellitus, dyslipidemia, hypertension, obesity and a sedentary lifestyle.     Review of systems  Constitutional: + Minimally fluctuating body weight,  current Body mass index is 41 kg/m. , no fatigue, no subjective hyperthermia, no subjective hypothermia Eyes: no blurry vision, no xerophthalmia ENT: no sore throat, no nodules palpated in throat, no dysphagia/odynophagia, no hoarseness Cardiovascular: no chest pain, no shortness of breath, no palpitations, no leg swelling Respiratory: no cough, no shortness of breath Gastrointestinal: no nausea/vomiting/diarrhea Musculoskeletal: no muscle/joint aches Skin: no rashes, no hyperemia Neurological: no tremors, no numbness, no tingling, no dizziness Psychiatric: no depression, no anxiety   Objective:    BP 138/81   Pulse 94   Ht 5' 6"  (1.676 m)   Wt 254 lb (115.2 kg)   BMI 41.00 kg/m   Wt Readings from Last 3 Encounters:  05/10/21 254 lb (115.2 kg)  11/28/20 249 lb (112.9 kg)  11/28/20 249 lb (112.9 kg)    BP Readings from Last 3 Encounters:  05/10/21 138/81  11/28/20 111/80  08/28/20 132/82      Physical Exam- Limited  Constitutional:  Body mass index is 41 kg/m. , not in acute distress, normal state of mind Eyes:  EOMI, no exophthalmos Neck: Supple Cardiovascular: RRR, no murmurs, rubs, or gallops, no edema Respiratory: Adequate breathing efforts, no crackles, rales, rhonchi, or wheezing Musculoskeletal: no  gross deformities, strength intact in all four extremities, no gross restriction of joint movements Skin:  no rashes, no hyperemia Neurological: no tremor with outstretched hands    Recent Results (from the past 2160 hour(s))  HgB A1c     Status: Abnormal   Collection Time: 05/10/21  2:09 PM  Result Value Ref Range   Hemoglobin A1C 7.7 (A) 4.0 - 5.6 %   HbA1c POC (<> result, manual entry)     HbA1c, POC (prediabetic range)     HbA1c, POC (controlled diabetic range)    POCT UA - Microalbumin     Status: Abnormal   Collection Time: 05/10/21  2:10 PM  Result Value Ref Range    Microalbumin Ur, POC 10 mg/L   Creatinine, POC 50 mg/dL   Albumin/Creatinine Ratio, Urine, POC 30-300    Lipid Panel     Component Value Date/Time   CHOL 201 (A) 11/21/2020 0000   TRIG 131 11/21/2020 0000   HDL 50 07/14/2018 0000   LDLCALC 123 11/21/2020 0000      Assessment & Plan:   1) Uncontrolled type 2 diabetes mellitus with complication, without long-term current use of insulin ([place])  - Patient has currently uncontrolled symptomatic type 2 DM since 50 years of age.  She presents today with her meter, no logs, showing improved fasting glycemic profile over the last few weeks.  Her POCT A1c today is 7.7%, increasing slightly from last visit of 7.1%.  She has been on multiple rounds of steroids recently for flare of psoriasis on her hands and feet (she is seeing Dermatology for this). She also had a peroid where she didn't take her Ozempic due to cost, but now that she has met her deductible, she has began taking it again and it shows in her glucose readings. She denies any hypoglycemia.  -I reviewed her recent labs which include normal renal function.     Her diabetes is complicated by obesity/sedentary life and patient remains at a high risk for more acute and chronic complications of diabetes which include CAD, CVA, CKD, retinopathy, and neuropathy. These are all discussed in detail with the patient.  - Nutritional counseling repeated at each appointment due to patients tendency to fall back in to old habits.  - The patient admits there is a room for improvement in their diet and drink choices. -  Suggestion is made for the patient to avoid simple carbohydrates from their diet including Cakes, Sweet Desserts / Pastries, Ice Cream, Soda (diet and regular), Sweet Tea, Candies, Chips, Cookies, Sweet Pastries, Store Bought Juices, Alcohol in Excess of 1-2 drinks a day, Artificial Sweeteners, Coffee Creamer, and "Sugar-free" Products. This will help patient to have stable blood glucose  profile and potentially avoid unintended weight gain.   - I encouraged the patient to switch to unprocessed or minimally processed complex starch and increased protein intake (animal or plant source), fruits, and vegetables.   - Patient is advised to stick to a routine mealtimes to eat 3 meals a day and avoid unnecessary snacks (to snack only to correct hypoglycemia).  -She is agreeable to seeing Jearld Fenton, RDE for a diabetes education.  - I have approached patient with the following individualized plan to manage diabetes and patient agrees:   -Given her recent control, she does not need insulin treatment for now.  However, if she loses control of her diabetes, basal insulin is the next step.  -She is advised to continue Ozempic 1 mg weekly, Metformin 1000 mg  twice daily with meals, Glipizide XL 5 mg daily with breakfast.  -She is encouraged to continue monitoring blood glucose at least once daily, before breakfast and as needed, and to call the clinic if she has readings less than 70 or greater than 200 for 3 tests in a row.    2) BP/HTN: Her blood pressure is controlled to target.  She is advised to continue Lisinopril 20 mg p.o. daily.   3) Lipids/HPL:  Her most recent lipid panel from 11/21/20 shows uncontrolled LDL of 123.  She reports she sometimes forgets to take her cholesterol pill.  She is advised to continue Atorvastatin 20 mg po daily at bedtime and be consistent with taking it.  Side effects and precautions discussed with her.  4)  Weight/Diet:  Her Body mass index is 41 kg/m.-- clearly complicating her DM management.  She is a candidate for modest weight loss.  CDE Consult in progress, exercise, and detailed carbohydrates information provided.   5) Chronic Care/Health Maintenance: -Patient is on ACEI and statin encouraged to continue to follow up with Ophthalmology, Podiatrist at least yearly or according to recommendations, and advised to stay away from smoking. I have  recommended yearly flu vaccine and pneumonia vaccination at least every 5 years; moderate intensity exercise for up to 150 minutes weekly; and  sleep for at least 7 hours a day.  - I advised patient to maintain close follow up with for primary care needs.    I spent 30 minutes in the care of the patient today including review of labs from [facility], Lipids, Thyroid Function, Hematology (current and previous including abstractions from other facilities); face-to-face time discussing  her blood glucose readings/logs, discussing hypoglycemia and hyperglycemia episodes and symptoms, medications doses, her options of short and long term treatment based on the latest standards of care / guidelines;  discussion about incorporating lifestyle medicine;  and documenting the encounter.    Please refer to Patient Instructions for Blood Glucose Monitoring and Insulin/Medications Dosing Guide"  in media tab for additional information. Please  also refer to " Patient Self Inventory" in the Media  tab for reviewed elements of pertinent patient history.  Stacy Clarke participated in the discussions, expressed understanding, and voiced agreement with the above plans.  All questions were answered to her satisfaction. she is encouraged to contact clinic should she have any questions or concerns prior to her return visit.    Follow up plan: - Return in about 4 months (around 09/09/2021) for Diabetes F/U with A1c in office, No previsit labs, Bring meter and logs.  Rayetta Pigg, [facility] [address] [place], [place] [address] Phone: [phone] Fax: [phone]  05/10/2021, 2:21 PM

## 2021-07-03 ENCOUNTER — Other Ambulatory Visit: Payer: Self-pay | Admitting: "Endocrinology

## 2021-07-03 NOTE — Telephone Encounter (Signed)
This medication has not been filled since 2020. Okay to fill this or does her PCP need to fill?

## 2021-07-18 ENCOUNTER — Other Ambulatory Visit: Payer: Self-pay | Admitting: Nurse Practitioner

## 2021-09-10 ENCOUNTER — Other Ambulatory Visit: Payer: Self-pay

## 2021-09-10 ENCOUNTER — Encounter: Payer: Self-pay | Admitting: Nurse Practitioner

## 2021-09-10 ENCOUNTER — Ambulatory Visit (INDEPENDENT_AMBULATORY_CARE_PROVIDER_SITE_OTHER): Payer: 59 | Admitting: Nurse Practitioner

## 2021-09-10 VITALS — BP 124/80 | HR 85 | Ht 66.0 in | Wt 245.0 lb

## 2021-09-10 DIAGNOSIS — E782 Mixed hyperlipidemia: Secondary | ICD-10-CM

## 2021-09-10 DIAGNOSIS — E119 Type 2 diabetes mellitus without complications: Secondary | ICD-10-CM

## 2021-09-10 DIAGNOSIS — I10 Essential (primary) hypertension: Secondary | ICD-10-CM | POA: Diagnosis not present

## 2021-09-10 LAB — POCT GLYCOSYLATED HEMOGLOBIN (HGB A1C): HbA1c, POC (controlled diabetic range): 6.8 % (ref 0.0–7.0)

## 2021-09-10 MED ORDER — METFORMIN HCL ER 500 MG PO TB24: 1000.0000 mg | ORAL_TABLET | Freq: Every day | ORAL | 3 refills | Status: DC

## 2021-09-10 MED ORDER — OZEMPIC (1 MG/DOSE) 4 MG/3ML ~~LOC~~ SOPN: 1.0000 mg | PEN_INJECTOR | SUBCUTANEOUS | 3 refills | Status: DC

## 2021-09-10 MED ORDER — GLIPIZIDE ER 5 MG PO TB24: 5.0000 mg | ORAL_TABLET | Freq: Every day | ORAL | 3 refills | Status: DC

## 2021-09-10 NOTE — Progress Notes (Signed)
09/10/2021  Endocrinology follow-up note   Subjective:    Patient ID: Stacy Clarke, female    DOB: October 23, 1970. Patient is being seen in follow-up for management of type 2  Diabetes, HTN requested by  Joaquin Courts, DO  Past Medical History:  Diagnosis Date   HTN (hypertension) 08/28/2012    Social History   Socioeconomic History   Marital status: Married    Spouse name: Not on file   Number of children: Not on file   Years of education: Not on file   Highest education level: Not on file  Occupational History   Not on file  Tobacco Use   Smoking status: Former   Smokeless tobacco: Never  Vaping Use   Vaping Use: Never used  Substance and Sexual Activity   Alcohol use: No   Drug use: No   Sexual activity: Not on file  Other Topics Concern   Not on file  Social History Narrative   Not on file   Social Determinants of Health   Financial Resource Strain: Not on file  Food Insecurity: Not on file  Transportation Needs: Not on file  Physical Activity: Not on file  Stress: Not on file  Social Connections: Not on file   Outpatient Encounter Medications as of 09/10/2021  Medication Sig   aspirin 81 MG EC tablet Take 81 mg by mouth daily. Swallow whole.   atorvastatin (LIPITOR) 20 MG tablet TAKE 1 TABLET BY MOUTH EVERY DAY   cholecalciferol (VITAMIN D3) 25 MCG (1000 UNIT) tablet Take 5,000 Units by mouth daily.   clobetasol ointment (TEMOVATE) 0.05 % Apply topically at bedtime.   doxycycline (VIBRAMYCIN) 100 MG capsule Take 100 mg by mouth 2 (two) times daily.   famotidine (PEPCID) 20 MG tablet Take by mouth.   folic acid (FOLVITE) 1 MG tablet Take 1 mg by mouth daily.   lisinopril (PRINIVIL,ZESTRIL) 20 MG tablet Take 20 mg by mouth daily.   methotrexate (RHEUMATREX) 2.5 MG tablet once a week. 10 tablets weekly   Omega-3 Fatty Acids (FISH OIL) 1000 MG CAPS Take by mouth daily.   ONETOUCH VERIO test strip USE AS INSTRUCTED TO TEST BLOOD GLUCOSE  TWO TIMES DAILY   PARoxetine (PAXIL) 20 MG tablet Take 20 mg by mouth daily.   triamcinolone cream (KENALOG) 0.1 % Apply 1 application topically at bedtime.   [DISCONTINUED] glipiZIDE (GLUCOTROL XL) 5 MG 24 hr tablet TAKE 1 TABLET BY MOUTH EVERY DAY WITH BREAKFAST   [DISCONTINUED] metFORMIN (GLUCOPHAGE-XR) 500 MG 24 hr tablet Take 1,000 mg by mouth 2 (two) times daily.   [DISCONTINUED] OZEMPIC, 1 MG/DOSE, 4 MG/3ML SOPN Inject 1 mg into the skin once a week.   glipiZIDE (GLUCOTROL XL) 5 MG 24 hr tablet Take 1 tablet (5 mg total) by mouth daily with breakfast.   metFORMIN (GLUCOPHAGE-XR) 500 MG 24 hr tablet Take 2 tablets (1,000 mg total) by mouth daily with breakfast.   OZEMPIC, 1 MG/DOSE, 4 MG/3ML SOPN Inject 1 mg into the skin once a week.   [DISCONTINUED] metFORMIN (GLUCOPHAGE) 500 MG tablet Take 2 tablets (1,000 mg total) by mouth 2 (two) times daily after a meal. (Patient not taking: Reported on 09/10/2021)   [DISCONTINUED] Semaglutide, 1 MG/DOSE, (OZEMPIC, 1 MG/DOSE,) 2 MG/1.5ML SOPN INJECT 1 MG SUBCUTANEOUSLY ONCE A WEEK (Patient not taking: Reported on 09/10/2021)   No facility-administered encounter medications on file as of 09/10/2021.   ALLERGIES: No Known Allergies VACCINATION  STATUS: There is no immunization history for the selected administration types on file for this patient.   Diabetes She presents for her follow-up diabetic visit. She has type 2 diabetes mellitus. Onset time: She was diagnosed at approximate age of 57 years after episodes of gestational diabetes with 3 of her pregnancies from 15 until 2014. Her disease course has been improving. There are no hypoglycemic associated symptoms. Pertinent negatives for hypoglycemia include no confusion, headaches, pallor, sweats or tremors. Pertinent negatives for diabetes include no chest pain, no fatigue, no polydipsia, no polyphagia, no polyuria and no weight loss. There are no hypoglycemic complications. Symptoms are stable.  There are no diabetic complications. Risk factors for coronary artery disease include diabetes mellitus, hypertension, obesity, sedentary lifestyle and dyslipidemia. Current diabetic treatment includes oral agent (dual therapy) (and ozempic). She is compliant with treatment most of the time. Her weight is decreasing steadily. She is following a diabetic diet. When asked about meal planning, she reported none. She has not had a previous visit with a dietitian. She participates in exercise intermittently. Her home blood glucose trend is decreasing steadily. Her breakfast blood glucose range is generally 130-140 mg/dl. (She presents today with her meter, no logs, showing improved at goal fasting glycemic profile.  Her POCT A1c today is 6.8%, improving from last visit of 7.7%.  She did have 2 incidences of hypoglycemia, likely due to meal timing, denies any recently.  She has lost 11 lbs since last visit.) An ACE inhibitor/angiotensin II receptor blocker is being taken. She does not see a podiatrist.Eye exam is current.  Hypertension This is a chronic problem. The current episode started more than 1 year ago. The problem has been resolved since onset. The problem is controlled. Pertinent negatives include no chest pain, headaches, palpitations, shortness of breath or sweats. There are no associated agents to hypertension. Risk factors for coronary artery disease include diabetes mellitus, obesity, sedentary lifestyle and dyslipidemia. Past treatments include ACE inhibitors. The current treatment provides moderate improvement. There are no compliance problems.  Identifiable causes of hypertension include chronic renal disease.  Hyperlipidemia This is a chronic problem. The current episode started more than 1 year ago. The problem is uncontrolled. Recent lipid tests were reviewed and are high. Exacerbating diseases include chronic renal disease, diabetes and obesity. There are no known factors aggravating her  hyperlipidemia. Pertinent negatives include no chest pain, myalgias or shortness of breath. Current antihyperlipidemic treatment includes statins. The current treatment provides moderate improvement of lipids. Compliance problems: sometimes forgets to take her cholesterol pill.  Risk factors for coronary artery disease include diabetes mellitus, dyslipidemia, hypertension, obesity and a sedentary lifestyle.    Review of systems  Constitutional: + steadily decreasing body weight,  current Body mass index is 39.54 kg/m. , no fatigue, no subjective hyperthermia, no subjective hypothermia Eyes: no blurry vision, no xerophthalmia ENT: no sore throat, no nodules palpated in throat, no dysphagia/odynophagia, no hoarseness Cardiovascular: no chest pain, no shortness of breath, no palpitations, no leg swelling Respiratory: no cough, no shortness of breath Gastrointestinal: no nausea/vomiting/diarrhea Musculoskeletal: no muscle/joint aches Skin: no rashes, no hyperemia Neurological: no tremors, no numbness, no tingling, no dizziness Psychiatric: no depression, no anxiety   Objective:    BP 124/80   Pulse 85   Ht 5\' 6"  (1.676 m)   Wt 245 lb (111.1 kg)   BMI 39.54 kg/m   Wt Readings from Last 3 Encounters:  09/10/21 245 lb (111.1 kg)  05/10/21 254 lb (115.2 kg)  11/28/20 249 lb (112.9 kg)    BP Readings from Last 3 Encounters:  09/10/21 124/80  05/10/21 138/81  11/28/20 111/80     Physical Exam- Limited  Constitutional:  Body mass index is 39.54 kg/m. , not in acute distress, normal state of mind Eyes:  EOMI, no exophthalmos Neck: Supple Cardiovascular: RRR, no murmurs, rubs, or gallops, no edema Respiratory: Adequate breathing efforts, no crackles, rales, rhonchi, or wheezing Musculoskeletal: no gross deformities, strength intact in all four extremities, no gross restriction of joint movements Skin:  no rashes, no hyperemia Neurological: no tremor with outstretched  hands    Recent Results (from the past 2160 hour(s))  POCT glycosylated hemoglobin (Hb A1C)     Status: Normal   Collection Time: 09/10/21  2:26 PM  Result Value Ref Range   Hemoglobin A1C     HbA1c POC (<> result, manual entry)     HbA1c, POC (prediabetic range)     HbA1c, POC (controlled diabetic range) 6.8 0.0 - 7.0 %    Lipid Panel     Component Value Date/Time   CHOL 201 (A) 11/21/2020 0000   TRIG 131 11/21/2020 0000   HDL 50 07/14/2018 0000   LDLCALC 123 11/21/2020 0000      Assessment & Plan:   1) Uncontrolled type 2 diabetes mellitus with complication, without long-term current use of insulin (HCC)  - Patient has currently uncontrolled symptomatic type 2 DM since 50 years of age.  She presents today with her meter, no logs, showing improved at goal fasting glycemic profile.  Her POCT A1c today is 6.8%, improving from last visit of 7.7%.  She did have 2 incidences of hypoglycemia, likely due to meal timing, denies any recently.  She has lost 11 lbs since last visit.  -I reviewed her recent labs which include normal renal function.     Her diabetes is complicated by obesity/sedentary life and patient remains at a high risk for more acute and chronic complications of diabetes which include CAD, CVA, CKD, retinopathy, and neuropathy. These are all discussed in detail with the patient.  - Nutritional counseling repeated at each appointment due to patients tendency to fall back in to old habits.  - The patient admits there is a room for improvement in their diet and drink choices. -  Suggestion is made for the patient to avoid simple carbohydrates from their diet including Cakes, Sweet Desserts / Pastries, Ice Cream, Soda (diet and regular), Sweet Tea, Candies, Chips, Cookies, Sweet Pastries, Store Bought Juices, Alcohol in Excess of 1-2 drinks a day, Artificial Sweeteners, Coffee Creamer, and "Sugar-free" Products. This will help patient to have stable blood glucose profile  and potentially avoid unintended weight gain.   - I encouraged the patient to switch to unprocessed or minimally processed complex starch and increased protein intake (animal or plant source), fruits, and vegetables.   - Patient is advised to stick to a routine mealtimes to eat 3 meals a day and avoid unnecessary snacks (to snack only to correct hypoglycemia).  -She is agreeable to seeing Norm Salt, RDE for a diabetes education.  - I have approached patient with the following individualized plan to manage diabetes and patient agrees:   -Given her improved, near target glycemic profile, she is advised to continue Ozempic 1 mg weekly, Metformin 1000 mg ER daily with breakfast, and Glipizide XL 5 mg daily with breakfast.  -She is encouraged to continue monitoring blood glucose at least once daily, before breakfast and as needed,  and to call the clinic if she has readings less than 70 or greater than 200 for 3 tests in a row.    2) BP/HTN: Her blood pressure is controlled to target.  She is advised to continue Lisinopril 20 mg p.o. daily.   3) Lipids/HPL:  Her most recent lipid panel from 11/21/20 shows uncontrolled LDL of 123.  She reports she sometimes forgets to take her cholesterol pill.  She is advised to continue Atorvastatin 20 mg po daily at bedtime and be consistent with taking it.  Side effects and precautions discussed with her.  She has appt with PCP tomorrow who is running labs on her.  Will request copy.  4)  Weight/Diet:  Her Body mass index is 39.54 kg/m.-- clearly complicating her DM management.  She is a candidate for modest weight loss.  CDE Consult in progress, exercise, and detailed carbohydrates information provided.   5) Chronic Care/Health Maintenance: -Patient is on ACEI and statin encouraged to continue to follow up with Ophthalmology, Podiatrist at least yearly or according to recommendations, and advised to stay away from smoking. I have recommended yearly flu  vaccine and pneumonia vaccination at least every 5 years; moderate intensity exercise for up to 150 minutes weekly; and  sleep for at least 7 hours a day.  - I advised patient to maintain close follow up with for primary care needs.     I spent 32 minutes in the care of the patient today including review of labs from CMP, Lipids, Thyroid Function, Hematology (current and previous including abstractions from other facilities); face-to-face time discussing  her blood glucose readings/logs, discussing hypoglycemia and hyperglycemia episodes and symptoms, medications doses, her options of short and long term treatment based on the latest standards of care / guidelines;  discussion about incorporating lifestyle medicine;  and documenting the encounter.    Please refer to Patient Instructions for Blood Glucose Monitoring and Insulin/Medications Dosing Guide"  in media tab for additional information. Please  also refer to " Patient Self Inventory" in the Media  tab for reviewed elements of pertinent patient history.  Stacy Clarke participated in the discussions, expressed understanding, and voiced agreement with the above plans.  All questions were answered to her satisfaction. she is encouraged to contact clinic should she have any questions or concerns prior to her return visit.    Follow up plan: - Return in about 4 months (around 01/09/2022) for Diabetes F/U with A1c in office, No previsit labs, Bring meter and logs.  Ronny Bacon, [facility] [address] [place], [place] [address] Phone: [phone] Fax: [phone]  09/10/2021, 2:54 PM

## 2021-09-10 NOTE — Patient Instructions (Signed)

## 2021-11-27 LAB — CBC AND DIFFERENTIAL
HCT: 34 — AB (ref 36–46)
Hemoglobin: 11.6 — AB (ref 12.0–16.0)
Neutrophils Absolute: 5.8
Platelets: 308 10*3/uL (ref 150–400)
WBC: 10

## 2021-11-27 LAB — BASIC METABOLIC PANEL WITH GFR
BUN: 7 (ref 4–21)
CO2: 23 — AB (ref 13–22)
Chloride: 106 (ref 99–108)
Creatinine: 0.7 (ref <=1.1)
Glucose: 137
Potassium: 4 meq/L (ref 3.5–5.1)
Sodium: 144 (ref 137–147)

## 2021-11-27 LAB — HEPATIC FUNCTION PANEL
ALT: 11 U/L (ref 7–35)
AST: 13 (ref 13–35)
Alkaline Phosphatase: 120 (ref 25–125)
Bilirubin, Total: 0.2

## 2021-11-27 LAB — COMPREHENSIVE METABOLIC PANEL WITH GFR
Albumin: 4.2 (ref 3.5–5.0)
Calcium: 9.7 (ref 8.7–10.7)

## 2021-11-27 LAB — CBC: RBC: 3.74 — AB (ref 3.87–5.11)

## 2021-12-31 ENCOUNTER — Other Ambulatory Visit: Payer: Self-pay | Admitting: Nurse Practitioner

## 2022-01-09 NOTE — Patient Instructions (Signed)

## 2022-01-10 ENCOUNTER — Ambulatory Visit (INDEPENDENT_AMBULATORY_CARE_PROVIDER_SITE_OTHER): Payer: 59 | Admitting: Nurse Practitioner

## 2022-01-10 ENCOUNTER — Encounter: Payer: Self-pay | Admitting: Nurse Practitioner

## 2022-01-10 VITALS — BP 130/87 | HR 99 | Ht 66.0 in | Wt 238.0 lb

## 2022-01-10 DIAGNOSIS — I10 Essential (primary) hypertension: Secondary | ICD-10-CM | POA: Diagnosis not present

## 2022-01-10 DIAGNOSIS — E782 Mixed hyperlipidemia: Secondary | ICD-10-CM | POA: Diagnosis not present

## 2022-01-10 DIAGNOSIS — E119 Type 2 diabetes mellitus without complications: Secondary | ICD-10-CM | POA: Diagnosis not present

## 2022-01-10 LAB — POCT GLYCOSYLATED HEMOGLOBIN (HGB A1C): HbA1c, POC (controlled diabetic range): 6.6 % (ref 0.0–7.0)

## 2022-01-10 MED ORDER — SEMAGLUTIDE (2 MG/DOSE) 8 MG/3ML ~~LOC~~ SOPN
2.0000 mg | PEN_INJECTOR | SUBCUTANEOUS | 3 refills | Status: DC
Start: 2022-01-10 — End: 2022-06-06

## 2022-01-10 NOTE — Progress Notes (Signed)
? ? ?        01/10/2022 ? ?Endocrinology follow-up note ? ? ?Subjective:  ? ? Patient ID: Stacy Clarke, female    DOB: [birth year]. Patient is being seen in follow-up for management of type 2  Diabetes, HTN requested by  Jacqualine Code, DO ? ?Past Medical History:  ?Diagnosis Date  ? HTN (hypertension) 08/28/2012  ? ? ?Social History  ? ?Socioeconomic History  ? Marital status: Married  ?  Spouse name: Not on file  ? Number of children: Not on file  ? Years of education: Not on file  ? Highest education level: Not on file  ?Occupational History  ? Not on file  ?Tobacco Use  ? Smoking status: Former  ? Smokeless tobacco: Never  ?Vaping Use  ? Vaping Use: Never used  ?Substance and Sexual Activity  ? Alcohol use: No  ? Drug use: No  ? Sexual activity: Not on file  ?Other Topics Concern  ? Not on file  ?Social History Narrative  ? Not on file  ? ?Social Determinants of Health  ? ?Financial Resource Strain: Not on file  ?Food Insecurity: Not on file  ?Transportation Needs: Not on file  ?Physical Activity: Not on file  ?Stress: Not on file  ?Social Connections: Not on file  ? ?Outpatient Encounter Medications as of 01/10/2022  ?Medication Sig  ? Semaglutide, 2 MG/DOSE, 8 MG/3ML SOPN Inject 2 mg as directed once a week.  ? aspirin 81 MG EC tablet Take 81 mg by mouth daily. Swallow whole.  ? atorvastatin (LIPITOR) 20 MG tablet TAKE 1 TABLET BY MOUTH EVERY DAY  ? cholecalciferol (VITAMIN D3) 25 MCG (1000 UNIT) tablet Take 5,000 Units by mouth daily.  ? clobetasol ointment (TEMOVATE) 0.05 % Apply topically at bedtime.  ? doxycycline (VIBRAMYCIN) 100 MG capsule Take 100 mg by mouth 2 (two) times daily as needed.  ? famotidine (PEPCID) 20 MG tablet Take by mouth.  ? folic acid (FOLVITE) 1 MG tablet Take 1 mg by mouth daily.  ? lisinopril (PRINIVIL,ZESTRIL) 20 MG tablet Take 20 mg by mouth daily.  ? metFORMIN (GLUCOPHAGE-XR) 500 MG 24 hr tablet Take 2 tablets (1,000 mg total) by mouth daily with breakfast.  ? methotrexate  (RHEUMATREX) 2.5 MG tablet once a week. 10 tablets weekly  ? Omega-3 Fatty Acids (FISH OIL) 1000 MG CAPS Take by mouth daily.  ? ONETOUCH VERIO test strip USE AS INSTRUCTED TO TEST BLOOD GLUCOSE TWO TIMES DAILY  ? PARoxetine (PAXIL) 20 MG tablet Take 20 mg by mouth daily.  ? triamcinolone cream (KENALOG) 0.1 % Apply 1 application topically at bedtime.  ? [DISCONTINUED] glipiZIDE (GLUCOTROL XL) 5 MG 24 hr tablet Take 1 tablet (5 mg total) by mouth daily with breakfast.  ? [DISCONTINUED] OZEMPIC, 1 MG/DOSE, 4 MG/3ML SOPN Inject 1 mg into the skin once a week.  ? ?No facility-administered encounter medications on file as of 01/10/2022.  ? ?ALLERGIES: ?No Known Allergies ?VACCINATION STATUS: ?Immunization History  ?Administered Date(s) Administered  ? Moderna Sars-Covid-2 Vaccination 04/05/2020, 05/03/2020  ? ? ? ?Diabetes ?She presents for her follow-up diabetic visit. She has type 2 diabetes mellitus. Onset time: She was diagnosed at approximate age of 84 years after episodes of gestational diabetes with 3 of her pregnancies from 59 until 2014. Her disease course has been improving. There are no hypoglycemic associated symptoms. Pertinent negatives for hypoglycemia include no confusion, headaches, pallor, sweats or tremors. Associated symptoms include weight loss. Pertinent negatives for diabetes include  no chest pain, no fatigue, no polydipsia, no polyphagia and no polyuria. There are no hypoglycemic complications. Symptoms are stable. There are no diabetic complications. Risk factors for coronary artery disease include diabetes mellitus, hypertension, obesity, sedentary lifestyle and dyslipidemia. Current diabetic treatment includes oral agent (dual therapy) (and ozempic). She is compliant with treatment most of the time. Her weight is decreasing steadily. She is following a diabetic diet. When asked about meal planning, she reported none. She has not had a previous visit with a dietitian. She participates in  exercise intermittently. Her home blood glucose trend is decreasing steadily. Her breakfast blood glucose range is generally 130-140 mg/dl. (She presents today with her meter, no logs, showing stable, at goal fasting glycemic profile.  Her POCT A1c today is 6.6%, improving from last visit of 6.8%.  She is tolerating the Ozempic well, has lost 7 lbs since last visit.  She denies any s/s of hypoglycemia.) An ACE inhibitor/angiotensin II receptor blocker is being taken. She does not see a podiatrist.Eye exam is current.  ?Hypertension ?This is a chronic problem. The current episode started more than 1 year ago. The problem has been resolved since onset. The problem is controlled. Pertinent negatives include no chest pain, headaches, palpitations, shortness of breath or sweats. There are no associated agents to hypertension. Risk factors for coronary artery disease include diabetes mellitus, obesity, sedentary lifestyle and dyslipidemia. Past treatments include ACE inhibitors. The current treatment provides moderate improvement. There are no compliance problems.  Identifiable causes of hypertension include chronic renal disease.  ?Hyperlipidemia ?This is a chronic problem. The current episode started more than 1 year ago. The problem is uncontrolled. Recent lipid tests were reviewed and are high. Exacerbating diseases include chronic renal disease, diabetes and obesity. There are no known factors aggravating her hyperlipidemia. Pertinent negatives include no chest pain, myalgias or shortness of breath. Current antihyperlipidemic treatment includes statins. The current treatment provides moderate improvement of lipids. Compliance problems: sometimes forgets to take her cholesterol pill.  Risk factors for coronary artery disease include diabetes mellitus, dyslipidemia, hypertension, obesity and a sedentary lifestyle.  ? ? ?Review of systems ? ?Constitutional: + steadily decreasing body weight,  current Body mass index is  38.41 kg/m?. , no fatigue, no subjective hyperthermia, no subjective hypothermia ?Eyes: no blurry vision, no xerophthalmia ?ENT: no sore throat, no nodules palpated in throat, no dysphagia/odynophagia, no hoarseness ?Cardiovascular: no chest pain, no shortness of breath, no palpitations, no leg swelling ?Respiratory: no cough, no shortness of breath ?Gastrointestinal: no nausea/vomiting/diarrhea ?Musculoskeletal: no muscle/joint aches ?Skin: no rashes, no hyperemia ?Neurological: no tremors, no numbness, no tingling, no dizziness ?Psychiatric: no depression, no anxiety ? ? ?Objective:  ?  ?BP 130/87   Pulse 99   Ht 5\' 6"  (1.676 m)   Wt 238 lb (108 kg)   BMI 38.41 kg/m?   ?Wt Readings from Last 3 Encounters:  ?01/10/22 238 lb (108 kg)  ?09/10/21 245 lb (111.1 kg)  ?05/10/21 254 lb (115.2 kg)  ?  ?BP Readings from Last 3 Encounters:  ?01/10/22 130/87  ?09/10/21 124/80  ?05/10/21 138/81  ? ? ? ?Physical Exam- Limited ? ?Constitutional:  Body mass index is 38.41 kg/m?. , not in acute distress, normal state of mind ?Eyes:  EOMI, no exophthalmos ?Neck: Supple ?Cardiovascular: RRR, no murmurs, rubs, or gallops, no edema ?Respiratory: Adequate breathing efforts, no crackles, rales, rhonchi, or wheezing ?Musculoskeletal: no gross deformities, strength intact in all four extremities, no gross restriction of joint movements ?Skin:  no  rashes, no hyperemia ?Neurological: no tremor with outstretched hands ? ? ?Diabetic Foot Exam - Simple   ?Simple Foot Form ?Diabetic Foot exam was performed with the following findings: Yes 01/10/2022  2:07 PM  ?Visual Inspection ?No deformities, no ulcerations, no other skin breakdown bilaterally: Yes ?Sensation Testing ?Intact to touch and monofilament testing bilaterally: Yes ?Pulse Check ?Posterior Tibialis and Dorsalis pulse intact bilaterally: Yes ?Comments ?  ? ? ? ?Recent Results (from the past 2160 hour(s))  ?CBC and differential     Status: Abnormal  ? Collection Time: 11/27/21 12:00  AM  ?Result Value Ref Range  ? Hemoglobin 11.6 (A) 12.0 - 16.0  ? HCT 34 (A) 36 - 46  ? Neutrophils Absolute 5.80   ? Platelets 308 150 - 400 K/uL  ? WBC 10.0   ?CBC     Status: Abnormal  ? Collection Time:

## 2022-04-05 ENCOUNTER — Other Ambulatory Visit: Payer: Self-pay | Admitting: Nurse Practitioner

## 2022-04-19 LAB — BASIC METABOLIC PANEL
BUN: 8 (ref 4–21)
CO2: 25 — AB (ref 13–22)
Chloride: 106 (ref 99–108)
Creatinine: 0.6 (ref 0.5–1.1)
Glucose: 77
Potassium: 4.5 mEq/L (ref 3.5–5.1)
Sodium: 142 (ref 137–147)

## 2022-04-19 LAB — HEPATIC FUNCTION PANEL
ALT: 14 U/L (ref 7–35)
AST: 12 — AB (ref 13–35)
Alkaline Phosphatase: 120 (ref 25–125)
Bilirubin, Total: 0.2

## 2022-04-19 LAB — COMPREHENSIVE METABOLIC PANEL: Albumin: 4.4 (ref 3.5–5.0)

## 2022-04-27 ENCOUNTER — Other Ambulatory Visit: Payer: Self-pay | Admitting: Nurse Practitioner

## 2022-05-13 ENCOUNTER — Telehealth: Payer: Self-pay | Admitting: Nurse Practitioner

## 2022-05-13 ENCOUNTER — Ambulatory Visit: Payer: 59 | Admitting: Nurse Practitioner

## 2022-05-13 NOTE — Telephone Encounter (Signed)
New message    Patient need a sign copy of lab order upcoming appt on 05/23/22  Fax # [phone] [facility] Physician

## 2022-05-13 NOTE — Telephone Encounter (Addendum)
Signed and faxed

## 2022-05-14 LAB — VITAMIN D 25 HYDROXY (VIT D DEFICIENCY, FRACTURES): Vit D, 25-Hydroxy: 45

## 2022-05-23 ENCOUNTER — Ambulatory Visit: Payer: 59 | Admitting: Nurse Practitioner

## 2022-06-06 ENCOUNTER — Ambulatory Visit (INDEPENDENT_AMBULATORY_CARE_PROVIDER_SITE_OTHER): Payer: 59 | Admitting: Nurse Practitioner

## 2022-06-06 ENCOUNTER — Encounter: Payer: Self-pay | Admitting: Nurse Practitioner

## 2022-06-06 VITALS — BP 119/81 | HR 80 | Ht 66.0 in | Wt 228.0 lb

## 2022-06-06 DIAGNOSIS — E119 Type 2 diabetes mellitus without complications: Secondary | ICD-10-CM

## 2022-06-06 LAB — POCT GLYCOSYLATED HEMOGLOBIN (HGB A1C): Hemoglobin A1C: 6.3 % — AB (ref 4.0–5.6)

## 2022-06-06 MED ORDER — SEMAGLUTIDE (2 MG/DOSE) 8 MG/3ML ~~LOC~~ SOPN: 2.0000 mg | PEN_INJECTOR | SUBCUTANEOUS | 3 refills | Status: DC

## 2022-06-06 MED ORDER — METFORMIN HCL ER 500 MG PO TB24: 1000.0000 mg | ORAL_TABLET | Freq: Every day | ORAL | 3 refills | Status: DC

## 2022-06-06 NOTE — Progress Notes (Signed)
06/06/2022  Endocrinology follow-up note   Subjective:    Patient ID: Stacy Clarke, female    DOB: 05/26/1971. Patient is being seen in follow-up for management of type 2  Diabetes, HTN requested by  Jacqualine Code, DO  Past Medical History:  Diagnosis Date   HTN (hypertension) 08/28/2012    Social History   Socioeconomic History   Marital status: Married    Spouse name: Not on file   Number of children: Not on file   Years of education: Not on file   Highest education level: Not on file  Occupational History   Not on file  Tobacco Use   Smoking status: Former   Smokeless tobacco: Never  Vaping Use   Vaping Use: Never used  Substance and Sexual Activity   Alcohol use: No   Drug use: No   Sexual activity: Not on file  Other Topics Concern   Not on file  Social History Narrative   Not on file   Social Determinants of Health   Financial Resource Strain: Not on file  Food Insecurity: Not on file  Transportation Needs: Not on file  Physical Activity: Not on file  Stress: Not on file  Social Connections: Not on file   Outpatient Encounter Medications as of 06/06/2022  Medication Sig   aspirin 81 MG EC tablet Take 81 mg by mouth daily. Swallow whole.   atorvastatin (LIPITOR) 20 MG tablet TAKE 1 TABLET BY MOUTH EVERY DAY   clobetasol ointment (TEMOVATE) 0.05 % Apply topically as needed.   doxycycline (VIBRAMYCIN) 100 MG capsule Take 100 mg by mouth 2 (two) times daily as needed.   famotidine (PEPCID) 20 MG tablet Take 20 mg by mouth daily.   folic acid (FOLVITE) 1 MG tablet Take 1 mg by mouth daily.   glucose blood (ONETOUCH VERIO) test strip Use as instructed to test blood glucose one time daily   lisinopril (PRINIVIL,ZESTRIL) 20 MG tablet Take 20 mg by mouth daily.   methotrexate (RHEUMATREX) 2.5 MG tablet once a week. Patient is currently taking 8 tablets a week.   PARoxetine (PAXIL) 20 MG tablet Take 20 mg by mouth daily.   triamcinolone cream  (KENALOG) 0.1 % Apply 1 application  topically as needed.   [DISCONTINUED] metFORMIN (GLUCOPHAGE-XR) 500 MG 24 hr tablet Take 2 tablets (1,000 mg total) by mouth daily with breakfast. (Patient taking differently: Take 1,000 mg by mouth daily with breakfast. Patient reports that she takes 1000 mg in the morning and 1000 mg at night.)   [DISCONTINUED] Semaglutide, 2 MG/DOSE, 8 MG/3ML SOPN Inject 2 mg as directed once a week.   cholecalciferol (VITAMIN D3) 25 MCG (1000 UNIT) tablet Take 5,000 Units by mouth daily. (Patient not taking: Reported on 06/06/2022)   metFORMIN (GLUCOPHAGE-XR) 500 MG 24 hr tablet Take 2 tablets (1,000 mg total) by mouth daily with breakfast.   Omega-3 Fatty Acids (FISH OIL) 1000 MG CAPS Take by mouth daily. (Patient not taking: Reported on 06/06/2022)   Semaglutide, 2 MG/DOSE, 8 MG/3ML SOPN Inject 2 mg as directed once a week.   No facility-administered encounter medications on file as of 06/06/2022.   ALLERGIES: No Known Allergies VACCINATION STATUS: Immunization History  Administered Date(s) Administered   Moderna Sars-Covid-2 Vaccination 04/05/2020, 05/03/2020     Diabetes She presents for her follow-up diabetic visit. She has type 2 diabetes mellitus. Onset time: She was diagnosed at approximate age of 19 years after  episodes of gestational diabetes with 3 of her pregnancies from 43 until 2014. Her disease course has been improving. There are no hypoglycemic associated symptoms. Pertinent negatives for hypoglycemia include no confusion, headaches, pallor, sweats or tremors. Associated symptoms include weight loss. Pertinent negatives for diabetes include no chest pain, no fatigue, no polydipsia, no polyphagia and no polyuria. There are no hypoglycemic complications. Symptoms are stable. There are no diabetic complications. Risk factors for coronary artery disease include diabetes mellitus, hypertension, obesity, sedentary lifestyle and dyslipidemia. Current diabetic  treatment includes oral agent (dual therapy) (and ozempic). She is compliant with treatment most of the time. Her weight is decreasing steadily. She is following a diabetic diet. When asked about meal planning, she reported none. She has not had a previous visit with a dietitian. She participates in exercise intermittently. Her home blood glucose trend is decreasing steadily. Her breakfast blood glucose range is generally 130-140 mg/dl. (She presents today with her meter, no logs, showing slightly above target fasting readings.  Her POCT A1c today is 6.3%, improving from last visit of 6.6%.  She did have COVID about 2 weeks ago and noticed her readings were a bit higher than usual.  She denies any hypoglycemia.) An ACE inhibitor/angiotensin II receptor blocker is being taken. She does not see a podiatrist.Eye exam is current.  Hypertension This is a chronic problem. The current episode started more than 1 year ago. The problem has been resolved since onset. The problem is controlled. Pertinent negatives include no chest pain, headaches, palpitations, shortness of breath or sweats. There are no associated agents to hypertension. Risk factors for coronary artery disease include diabetes mellitus, obesity, sedentary lifestyle and dyslipidemia. Past treatments include ACE inhibitors. The current treatment provides moderate improvement. There are no compliance problems.  Identifiable causes of hypertension include chronic renal disease.  Hyperlipidemia This is a chronic problem. The current episode started more than 1 year ago. The problem is uncontrolled. Recent lipid tests were reviewed and are high. Exacerbating diseases include chronic renal disease, diabetes and obesity. There are no known factors aggravating her hyperlipidemia. Pertinent negatives include no chest pain, myalgias or shortness of breath. Current antihyperlipidemic treatment includes statins. The current treatment provides moderate improvement of  lipids. Compliance problems: sometimes forgets to take her cholesterol pill.  Risk factors for coronary artery disease include diabetes mellitus, dyslipidemia, hypertension, obesity and a sedentary lifestyle.     Review of systems  Constitutional: + decreasing body weight,  current Body mass index is 36.8 kg/m. , no fatigue, no subjective hyperthermia, no subjective hypothermia Eyes: no blurry vision, no xerophthalmia ENT: no sore throat, no nodules palpated in throat, no dysphagia/odynophagia, no hoarseness Cardiovascular: no chest pain, no shortness of breath, no palpitations, no leg swelling Respiratory: no cough, no shortness of breath Gastrointestinal: no nausea/vomiting/diarrhea Musculoskeletal: no muscle/joint aches Skin: no rashes, no hyperemia Neurological: no tremors, no numbness, no tingling, no dizziness Psychiatric: no depression, no anxiety   Objective:    BP 119/81 (BP Location: Left Arm, Patient Position: Sitting, Cuff Size: Large)   Pulse 80   Ht 5\' 6"  (1.676 m)   Wt 228 lb (103.4 kg)   BMI 36.80 kg/m   Wt Readings from Last 3 Encounters:  06/06/22 228 lb (103.4 kg)  01/10/22 238 lb (108 kg)  09/10/21 245 lb (111.1 kg)    BP Readings from Last 3 Encounters:  06/06/22 119/81  01/10/22 130/87  09/10/21 124/80     Physical Exam- Limited  Constitutional:  Body  mass index is 36.8 kg/m. , not in acute distress, normal state of mind Eyes:  EOMI, no exophthalmos Neck: Supple Cardiovascular: RRR, no murmurs, rubs, or gallops, no edema Respiratory: Adequate breathing efforts, no crackles, rales, rhonchi, or wheezing Musculoskeletal: no gross deformities, strength intact in all four extremities, no gross restriction of joint movements Skin:  no rashes, no hyperemia Neurological: no tremor with outstretched hands   Diabetic Foot Exam - Simple   No data filed      Recent Results (from the past 2160 hour(s))  Basic metabolic panel     Status: Abnormal    Collection Time: 04/19/22 12:00 AM  Result Value Ref Range   Glucose 77    BUN 8 4 - 21   CO2 25 (A) 13 - 22   Creatinine 0.6 0.5 - 1.1   Potassium 4.5 3.5 - 5.1 mEq/L   Sodium 142 137 - 147   Chloride 106 99 - 108  Comprehensive metabolic panel     Status: None   Collection Time: 04/19/22 12:00 AM  Result Value Ref Range   Albumin 4.4 3.5 - 5.0  Hepatic function panel     Status: Abnormal   Collection Time: 04/19/22 12:00 AM  Result Value Ref Range   Alkaline Phosphatase 120 25 - 125   ALT 14 7 - 35 U/L   AST 12 (A) 13 - 35   Bilirubin, Total 0.2     Comment: <0.2  VITAMIN D 25 Hydroxy (Vit-D Deficiency, Fractures)     Status: None   Collection Time: 05/14/22 12:00 AM  Result Value Ref Range   Vit D, 25-Hydroxy 45.0   HgB A1c     Status: Abnormal   Collection Time: 06/06/22 11:20 AM  Result Value Ref Range   Hemoglobin A1C 6.3 (A) 4.0 - 5.6 %   HbA1c POC (<> result, manual entry)     HbA1c, POC (prediabetic range)     HbA1c, POC (controlled diabetic range)      Lipid Panel     Component Value Date/Time   CHOL 201 (A) 11/21/2020 0000   TRIG 131 11/21/2020 0000   HDL 50 07/14/2018 0000   LDLCALC 123 11/21/2020 0000      Assessment & Plan:   1) Controlled type 2 diabetes mellitus with complication, without long-term current use of insulin (HCC)  - Patient has currently uncontrolled symptomatic type 2 DM since 52 years of age.  She presents today with her meter, no logs, showing slightly above target fasting readings.  Her POCT A1c today is 6.3%, improving from last visit of 6.6%.  She did have COVID about 2 weeks ago and noticed her readings were a bit higher than usual.  She denies any hypoglycemia.  -I reviewed her recent labs which include normal renal function.     Her diabetes is complicated by obesity/sedentary life and patient remains at a high risk for more acute and chronic complications of diabetes which include CAD, CVA, CKD, retinopathy, and  neuropathy. These are all discussed in detail with the patient.  - Nutritional counseling repeated at each appointment due to patients tendency to fall back in to old habits.  - The patient admits there is a room for improvement in their diet and drink choices. -  Suggestion is made for the patient to avoid simple carbohydrates from their diet including Cakes, Sweet Desserts / Pastries, Ice Cream, Soda (diet and regular), Sweet Tea, Candies, Chips, Cookies, Sweet Pastries, Store Bought Juices, Alcohol in  Excess of 1-2 drinks a day, Artificial Sweeteners, Coffee Creamer, and "Sugar-free" Products. This will help patient to have stable blood glucose profile and potentially avoid unintended weight gain.   - I encouraged the patient to switch to unprocessed or minimally processed complex starch and increased protein intake (animal or plant source), fruits, and vegetables.   - Patient is advised to stick to a routine mealtimes to eat 3 meals a day and avoid unnecessary snacks (to snack only to correct hypoglycemia).  -She is agreeable to seeing Jearld Fenton, RDE for a diabetes education.  - I have approached patient with the following individualized plan to manage diabetes and patient agrees:   -She is advised to continue her Ozempic to 2 mg SQ weekly and Metformin 1000 mg ER daily with breakfast.   -She is encouraged to continue monitoring blood glucose at least once daily (especially during holiday season), before breakfast and as needed, and to call the clinic if she has readings less than 70 or greater than 200 for 3 tests in a row.    2) BP/HTN: Her blood pressure is controlled to target.  She is advised to continue Lisinopril 20 mg p.o. daily.   3) Lipids/HPL:  Her most recent lipid panel from 11/21/20 shows uncontrolled LDL of 123.  She reports she sometimes forgets to take her cholesterol pill.  She is advised to continue Atorvastatin 20 mg po daily at bedtime and be consistent with taking  it.  Side effects and precautions discussed with her.    4)  Weight/Diet:  Her Body mass index is 36.8 kg/m.-- clearly complicating her DM management.  She is a candidate for modest weight loss.  CDE Consult in progress, exercise, and detailed carbohydrates information provided.   5) Chronic Care/Health Maintenance: -Patient is on ACEI and statin encouraged to continue to follow up with Ophthalmology, Podiatrist at least yearly or according to recommendations, and advised to stay away from smoking. I have recommended yearly flu vaccine and pneumonia vaccination at least every 5 years; moderate intensity exercise for up to 150 minutes weekly; and  sleep for at least 7 hours a day.  - I advised patient to maintain close follow up with for primary care needs.     I spent 41 minutes in the care of the patient today including review of labs from [facility], Lipids, Thyroid Function, Hematology (current and previous including abstractions from other facilities); face-to-face time discussing  her blood glucose readings/logs, discussing hypoglycemia and hyperglycemia episodes and symptoms, medications doses, her options of short and long term treatment based on the latest standards of care / guidelines;  discussion about incorporating lifestyle medicine;  and documenting the encounter. Risk reduction counseling performed per USPSTF guidelines to reduce obesity and cardiovascular risk factors.     Please refer to Patient Instructions for Blood Glucose Monitoring and Insulin/Medications Dosing Guide"  in media tab for additional information. Please  also refer to " Patient Self Inventory" in the Media  tab for reviewed elements of pertinent patient history.  Stacy Clarke participated in the discussions, expressed understanding, and voiced agreement with the above plans.  All questions were answered to her satisfaction. she is encouraged to contact clinic should she have any questions or concerns prior to her  return visit.    Follow up plan: - Return in about 4 months (around 10/06/2022) for Diabetes F/U with A1c in office, No previsit labs, Bring meter and logs.  Rayetta Pigg, FNP-BC [facility] [address]  [address] [place], [place] [address] Phone: [phone] Fax: [phone]  06/06/2022, 11:29 AM

## 2022-10-07 ENCOUNTER — Ambulatory Visit: Payer: 59 | Admitting: Nurse Practitioner

## 2022-10-07 DIAGNOSIS — I10 Essential (primary) hypertension: Secondary | ICD-10-CM

## 2022-10-07 DIAGNOSIS — E119 Type 2 diabetes mellitus without complications: Secondary | ICD-10-CM

## 2022-10-07 DIAGNOSIS — E782 Mixed hyperlipidemia: Secondary | ICD-10-CM

## 2022-11-06 ENCOUNTER — Ambulatory Visit: Payer: 59 | Admitting: Nurse Practitioner

## 2022-11-06 ENCOUNTER — Encounter: Payer: Self-pay | Admitting: Nurse Practitioner

## 2022-11-06 VITALS — BP 120/74 | HR 94 | Ht 66.0 in | Wt 210.2 lb

## 2022-11-06 DIAGNOSIS — E119 Type 2 diabetes mellitus without complications: Secondary | ICD-10-CM

## 2022-11-06 DIAGNOSIS — I10 Essential (primary) hypertension: Secondary | ICD-10-CM | POA: Diagnosis not present

## 2022-11-06 DIAGNOSIS — E782 Mixed hyperlipidemia: Secondary | ICD-10-CM

## 2022-11-06 LAB — POCT GLYCOSYLATED HEMOGLOBIN (HGB A1C): Hemoglobin A1C: 7 % — AB (ref 4.0–5.6)

## 2022-11-06 MED ORDER — SEMAGLUTIDE (2 MG/DOSE) 8 MG/3ML ~~LOC~~ SOPN: 2.0000 mg | PEN_INJECTOR | SUBCUTANEOUS | 3 refills | Status: DC

## 2022-11-06 MED ORDER — METFORMIN HCL ER 500 MG PO TB24: 1000.0000 mg | ORAL_TABLET | Freq: Every day | ORAL | 3 refills | Status: DC

## 2022-11-06 NOTE — Progress Notes (Signed)
11/06/2022  Endocrinology follow-up note   Subjective:    Patient ID: Stacy Clarke, female    DOB: 1970-10-31. Patient is being seen in follow-up for management of type 2  Diabetes, HTN requested by  Jacqualine Code, DO  Past Medical History:  Diagnosis Date   HTN (hypertension) 08/28/2012    Social History   Socioeconomic History   Marital status: Married    Spouse name: Not on file   Number of children: Not on file   Years of education: Not on file   Highest education level: Not on file  Occupational History   Not on file  Tobacco Use   Smoking status: Former   Smokeless tobacco: Never  Vaping Use   Vaping Use: Never used  Substance and Sexual Activity   Alcohol use: No   Drug use: No   Sexual activity: Not on file  Other Topics Concern   Not on file  Social History Narrative   Not on file   Social Determinants of Health   Financial Resource Strain: Not on file  Food Insecurity: Not on file  Transportation Needs: Not on file  Physical Activity: Not on file  Stress: Not on file  Social Connections: Not on file   Outpatient Encounter Medications as of 11/06/2022  Medication Sig   aspirin 81 MG EC tablet Take 81 mg by mouth daily. Swallow whole.   atorvastatin (LIPITOR) 20 MG tablet TAKE 1 TABLET BY MOUTH EVERY DAY   famotidine (PEPCID) 20 MG tablet Take 20 mg by mouth daily.   folic acid (FOLVITE) 1 MG tablet Take 1 mg by mouth daily.   glucose blood (ONETOUCH VERIO) test strip Use as instructed to test blood glucose one time daily   lisinopril (PRINIVIL,ZESTRIL) 20 MG tablet Take 20 mg by mouth daily.   methotrexate (RHEUMATREX) 2.5 MG tablet once a week. Patient is currently taking 8 tablets a week.   PARoxetine (PAXIL) 20 MG tablet Take 20 mg by mouth daily.   triamcinolone cream (KENALOG) 0.1 % Apply 1 application  topically as needed.   [DISCONTINUED] metFORMIN (GLUCOPHAGE-XR) 500 MG 24 hr tablet Take 2 tablets (1,000 mg total) by mouth  daily with breakfast.   [DISCONTINUED] Semaglutide, 2 MG/DOSE, 8 MG/3ML SOPN Inject 2 mg as directed once a week.   cholecalciferol (VITAMIN D3) 25 MCG (1000 UNIT) tablet Take 5,000 Units by mouth daily. (Patient not taking: Reported on 06/06/2022)   metFORMIN (GLUCOPHAGE-XR) 500 MG 24 hr tablet Take 2 tablets (1,000 mg total) by mouth daily with breakfast.   Omega-3 Fatty Acids (FISH OIL) 1000 MG CAPS Take by mouth daily. (Patient not taking: Reported on 06/06/2022)   Semaglutide, 2 MG/DOSE, 8 MG/3ML SOPN Inject 2 mg as directed once a week.   [DISCONTINUED] clobetasol ointment (TEMOVATE) 0.05 % Apply topically as needed. (Patient not taking: Reported on 11/06/2022)   [DISCONTINUED] doxycycline (VIBRAMYCIN) 100 MG capsule Take 100 mg by mouth 2 (two) times daily as needed. (Patient not taking: Reported on 11/06/2022)   No facility-administered encounter medications on file as of 11/06/2022.   ALLERGIES: No Known Allergies VACCINATION STATUS: Immunization History  Administered Date(s) Administered   Moderna Sars-Covid-2 Vaccination 04/05/2020, 05/03/2020     Diabetes She presents for her follow-up diabetic visit. She has type 2 diabetes mellitus. Onset time: She was diagnosed at approximate age of 44 years after episodes of gestational diabetes with 3 of her pregnancies from 2000 until  2014. Her disease course has been stable. There are no hypoglycemic associated symptoms. Pertinent negatives for hypoglycemia include no confusion, headaches, pallor, sweats or tremors. Associated symptoms include weight loss. Pertinent negatives for diabetes include no chest pain, no fatigue, no polydipsia, no polyphagia and no polyuria. There are no hypoglycemic complications. Symptoms are stable. There are no diabetic complications. Risk factors for coronary artery disease include diabetes mellitus, hypertension, obesity, sedentary lifestyle and dyslipidemia. Current diabetic treatment includes oral agent (monotherapy)  (and ozempic). She is compliant with treatment most of the time. Her weight is decreasing steadily. She is following a diabetic diet. When asked about meal planning, she reported none. She has not had a previous visit with a dietitian. She participates in exercise intermittently. Her home blood glucose trend is fluctuating minimally. Her breakfast blood glucose range is generally 130-140 mg/dl. (She presents today with her meter, no logs, showing stable at goal glycemic profile overall.  Her POCT A1c today is 7%, increasing slightly from last visit of 6.3%.  She denies any recent infections, no recent diet changes, did not really overeat over the holidays.  She does note she has not been as active lately which may be why her A1c is up some.) An ACE inhibitor/angiotensin II receptor blocker is being taken. She does not see a podiatrist.Eye exam is current.  Hypertension This is a chronic problem. The current episode started more than 1 year ago. The problem has been resolved since onset. The problem is controlled. Pertinent negatives include no chest pain, headaches, palpitations, shortness of breath or sweats. There are no associated agents to hypertension. Risk factors for coronary artery disease include diabetes mellitus, obesity, sedentary lifestyle and dyslipidemia. Past treatments include ACE inhibitors. The current treatment provides moderate improvement. There are no compliance problems.  Identifiable causes of hypertension include chronic renal disease.  Hyperlipidemia This is a chronic problem. The current episode started more than 1 year ago. The problem is uncontrolled. Recent lipid tests were reviewed and are high. Exacerbating diseases include chronic renal disease, diabetes and obesity. There are no known factors aggravating her hyperlipidemia. Pertinent negatives include no chest pain, myalgias or shortness of breath. Current antihyperlipidemic treatment includes statins. The current treatment  provides moderate improvement of lipids. Compliance problems: sometimes forgets to take her cholesterol pill.  Risk factors for coronary artery disease include diabetes mellitus, dyslipidemia, hypertension, obesity and a sedentary lifestyle.     Review of systems  Constitutional: + decreasing body weight,  current Body mass index is 33.93 kg/m. , no fatigue, no subjective hyperthermia, no subjective hypothermia Eyes: no blurry vision, no xerophthalmia ENT: no sore throat, no nodules palpated in throat, no dysphagia/odynophagia, no hoarseness Cardiovascular: no chest pain, no shortness of breath, no palpitations, no leg swelling Respiratory: no cough, no shortness of breath Gastrointestinal: no nausea/vomiting/diarrhea Musculoskeletal: no muscle/joint aches Skin: no rashes, no hyperemia Neurological: no tremors, no numbness, no tingling, no dizziness Psychiatric: no depression, no anxiety   Objective:    BP 120/74 (BP Location: Left Arm, Patient Position: Sitting, Cuff Size: Large)   Pulse 94   Ht 5\' 6"  (1.676 m)   Wt 210 lb 3.2 oz (95.3 kg)   BMI 33.93 kg/m   Wt Readings from Last 3 Encounters:  11/06/22 210 lb 3.2 oz (95.3 kg)  06/06/22 228 lb (103.4 kg)  01/10/22 238 lb (108 kg)    BP Readings from Last 3 Encounters:  11/06/22 120/74  06/06/22 119/81  01/10/22 130/87     Physical  Exam- Limited  Constitutional:  Body mass index is 33.93 kg/m. , not in acute distress, normal state of mind Eyes:  EOMI, no exophthalmos Musculoskeletal: no gross deformities, strength intact in all four extremities, no gross restriction of joint movements Skin:  no rashes, no hyperemia Neurological: no tremor with outstretched hands   Diabetic Foot Exam - Simple   No data filed      Recent Results (from the past 2160 hour(s))  HgB A1c     Status: Abnormal   Collection Time: 11/06/22  3:42 PM  Result Value Ref Range   Hemoglobin A1C 7.0 (A) 4.0 - 5.6 %   HbA1c POC (<> result,  manual entry)     HbA1c, POC (prediabetic range)     HbA1c, POC (controlled diabetic range)      Lipid Panel     Component Value Date/Time   CHOL 201 (A) 11/21/2020 0000   TRIG 131 11/21/2020 0000   HDL 50 07/14/2018 0000   LDLCALC 123 11/21/2020 0000      Assessment & Plan:   1) Controlled type 2 diabetes mellitus with complication, without long-term current use of insulin (HCC)  - Patient has currently uncontrolled symptomatic type 2 DM since 52 years of age.  She presents today with her meter, no logs, showing stable at goal glycemic profile overall.  Her POCT A1c today is 7%, increasing slightly from last visit of 6.3%.  She denies any recent infections, no recent diet changes, did not really overeat over the holidays.  She does note she has not been as active lately which may be why her A1c is up some.  -I reviewed her recent labs which include normal renal function.     Her diabetes is complicated by obesity/sedentary life and patient remains at a high risk for more acute and chronic complications of diabetes which include CAD, CVA, CKD, retinopathy, and neuropathy. These are all discussed in detail with the patient.  - Nutritional counseling repeated at each appointment due to patients tendency to fall back in to old habits.  - The patient admits there is a room for improvement in their diet and drink choices. -  Suggestion is made for the patient to avoid simple carbohydrates from their diet including Cakes, Sweet Desserts / Pastries, Ice Cream, Soda (diet and regular), Sweet Tea, Candies, Chips, Cookies, Sweet Pastries, Store Bought Juices, Alcohol in Excess of 1-2 drinks a day, Artificial Sweeteners, Coffee Creamer, and "Sugar-free" Products. This will help patient to have stable blood glucose profile and potentially avoid unintended weight gain.   - I encouraged the patient to switch to unprocessed or minimally processed complex starch and increased protein intake (animal  or plant source), fruits, and vegetables.   - Patient is advised to stick to a routine mealtimes to eat 3 meals a day and avoid unnecessary snacks (to snack only to correct hypoglycemia).  -She is agreeable to seeing Norm Salt, RDE for a diabetes education.  - I have approached patient with the following individualized plan to manage diabetes and patient agrees:   -She is advised to continue her Ozempic to 2 mg SQ weekly and Metformin 1000 mg ER daily with breakfast.   -She is encouraged to continue monitoring blood glucose at least once daily, before breakfast and as needed, and to call the clinic if she has readings less than 70 or greater than 200 for 3 tests in a row.  I did give her a sample Dexcom G7 to try and  see what foods are triggering higher spikes so she can adjust her diet accordingly.  2) BP/HTN: Her blood pressure is controlled to target.  She is advised to continue Lisinopril 20 mg p.o. daily.   3) Lipids/HPL:  Her most recent lipid panel from 11/21/20 shows uncontrolled LDL of 123.  She reports she sometimes forgets to take her cholesterol pill.  She is advised to continue Atorvastatin 20 mg po daily at bedtime and be consistent with taking it.  Side effects and precautions discussed with her.  I will order lipid panel to be checked prior to next visit.  4)  Weight/Diet:  Her Body mass index is 33.93 kg/m.-- clearly complicating her DM management.  She is a candidate for modest weight loss.  CDE Consult in progress, exercise, and detailed carbohydrates information provided.   5) Chronic Care/Health Maintenance: -Patient is on ACEI and statin encouraged to continue to follow up with Ophthalmology, Podiatrist at least yearly or according to recommendations, and advised to stay away from smoking. I have recommended yearly flu vaccine and pneumonia vaccination at least every 5 years; moderate intensity exercise for up to 150 minutes weekly; and  sleep for at least 7 hours a  day.  - I advised patient to maintain close follow up with for primary care needs.     I spent  44  minutes in the care of the patient today including review of labs from [place], Lipids, Thyroid Function, Hematology (current and previous including abstractions from other facilities); face-to-face time discussing  her blood glucose readings/logs, discussing hypoglycemia and hyperglycemia episodes and symptoms, medications doses, her options of short and long term treatment based on the latest standards of care / guidelines;  discussion about incorporating lifestyle medicine;  and documenting the encounter. Risk reduction counseling performed per USPSTF guidelines to reduce obesity and cardiovascular risk factors.     Please refer to Patient Instructions for Blood Glucose Monitoring and Insulin/Medications Dosing Guide"  in media tab for additional information. Please  also refer to " Patient Self Inventory" in the Media  tab for reviewed elements of pertinent patient history.  Armani Sporn participated in the discussions, expressed understanding, and voiced agreement with the above plans.  All questions were answered to her satisfaction. she is encouraged to contact clinic should she have any questions or concerns prior to her return visit.    Follow up plan: - Return in about 4 months (around 03/07/2023) for Diabetes F/U with A1c in office, Previsit labs, Bring meter and logs.  Rayetta Pigg, [facility] [address] [place], [place] [address] Phone: [phone] Fax: [phone]  11/06/2022, 4:30 PM

## 2022-11-25 ENCOUNTER — Telehealth: Payer: Self-pay | Admitting: *Deleted

## 2022-11-25 NOTE — Telephone Encounter (Signed)
Patient left a message that she had tried the samples , Whitney gave to her.  She is now requesting that a prescription be sent to her pharmacy , [facility] on [address] in [place] , for her.

## 2022-11-26 ENCOUNTER — Other Ambulatory Visit: Payer: Self-pay | Admitting: Nurse Practitioner

## 2022-11-26 MED ORDER — DEXCOM G7 SENSOR MISC: 1.0000 | 3 refills | Status: DC

## 2022-11-26 NOTE — Telephone Encounter (Signed)
Patient was called and made aware that it had been called in.

## 2022-11-26 NOTE — Telephone Encounter (Signed)
done 

## 2022-12-13 ENCOUNTER — Other Ambulatory Visit (HOSPITAL_COMMUNITY): Payer: Self-pay

## 2023-01-14 ENCOUNTER — Telehealth: Payer: Self-pay

## 2023-01-14 NOTE — Telephone Encounter (Signed)
PA request received via CMM for Dexcom G7 Sensor  PA has been submitted to [facility] and is pending additional questions/determination  Key: [id number]

## 2023-01-15 NOTE — Telephone Encounter (Signed)
Noted she was denied.  Nothing we can really do to over-turn it in this case.

## 2023-01-15 NOTE — Telephone Encounter (Signed)
Patient was called and made aware of the PA determination.

## 2023-01-15 NOTE — Telephone Encounter (Signed)
PA has been DENIED due to:   Per your health plan's criteria, this product is covered if you meet the following: (1) One of the following: (A) You are being treated with insulin. (B) You have a history of low blood sugar problems (problematic hypoglycemia) with at least one of the following: (I) More than one (recurrent) level 2 low blood sugar event [hypoglycemic events where glucose less than /dL (0.4VWUJ/W)] even with multiple (more than one) attempts to adjust drug(s) and/or change the diabetes treatment plan. (II) History of one level 3 low blood sugar event [hypoglycemic event where glucose less than / dL (1.1BJYN/W)] as seen by changes in your mental and/or physical state where you need help for treatment of low blood sugar levels (third-party assistance for the treatment of hypoglycemia). The information provided does not show that you meet the criteria listed above. Please speak with your doctor about your choices.

## 2023-03-11 ENCOUNTER — Ambulatory Visit: Payer: 59 | Admitting: Nurse Practitioner

## 2023-03-12 LAB — BASIC METABOLIC PANEL
BUN: 12 (ref 4–21)
Creatinine: 0.7 (ref 0.5–1.1)
Glucose: 140

## 2023-03-12 LAB — COMPREHENSIVE METABOLIC PANEL: eGFR: 103

## 2023-03-12 LAB — VITAMIN D 25 HYDROXY (VIT D DEFICIENCY, FRACTURES): Vit D, 25-Hydroxy: 27

## 2023-03-12 LAB — TSH: TSH: 2.88 (ref 0.41–5.90)

## 2023-03-12 LAB — LIPID PANEL
LDL Cholesterol: 91
Triglycerides: 90 (ref 40–160)

## 2023-03-18 ENCOUNTER — Ambulatory Visit (INDEPENDENT_AMBULATORY_CARE_PROVIDER_SITE_OTHER): Payer: 59 | Admitting: Nurse Practitioner

## 2023-03-18 ENCOUNTER — Encounter: Payer: Self-pay | Admitting: Nurse Practitioner

## 2023-03-18 VITALS — BP 112/76 | HR 81 | Ht 66.0 in | Wt 201.6 lb

## 2023-03-18 DIAGNOSIS — E782 Mixed hyperlipidemia: Secondary | ICD-10-CM | POA: Diagnosis not present

## 2023-03-18 DIAGNOSIS — I10 Essential (primary) hypertension: Secondary | ICD-10-CM | POA: Diagnosis not present

## 2023-03-18 DIAGNOSIS — Z7985 Long-term (current) use of injectable non-insulin antidiabetic drugs: Secondary | ICD-10-CM

## 2023-03-18 DIAGNOSIS — Z7984 Long term (current) use of oral hypoglycemic drugs: Secondary | ICD-10-CM | POA: Diagnosis not present

## 2023-03-18 DIAGNOSIS — E119 Type 2 diabetes mellitus without complications: Secondary | ICD-10-CM

## 2023-03-18 LAB — POCT UA - MICROALBUMIN
Creatinine, POC: 50 mg/dL
Microalbumin Ur, POC: 10 mg/L

## 2023-03-18 LAB — POCT GLYCOSYLATED HEMOGLOBIN (HGB A1C): Hemoglobin A1C: 6.4 % — AB (ref 4.0–5.6)

## 2023-03-18 NOTE — Progress Notes (Signed)
03/18/2023  Endocrinology follow-up note   Subjective:    Patient ID: Stacy Clarke, female    DOB: 05-11-1971. Patient is being seen in follow-up for management of type 2  Diabetes, HTN requested by  Joaquin Courts, DO  Past Medical History:  Diagnosis Date   HTN (hypertension) 08/28/2012    Social History   Socioeconomic History   Marital status: Married    Spouse name: Not on file   Number of children: Not on file   Years of education: Not on file   Highest education level: Not on file  Occupational History   Not on file  Tobacco Use   Smoking status: Former   Smokeless tobacco: Never  Vaping Use   Vaping Use: Never used  Substance and Sexual Activity   Alcohol use: No   Drug use: No   Sexual activity: Not on file  Other Topics Concern   Not on file  Social History Narrative   Not on file   Social Determinants of Health   Financial Resource Strain: Not on file  Food Insecurity: Not on file  Transportation Needs: Not on file  Physical Activity: Not on file  Stress: Not on file  Social Connections: Not on file   Outpatient Encounter Medications as of 03/18/2023  Medication Sig   aspirin 81 MG EC tablet Take 81 mg by mouth daily. Swallow whole.   atorvastatin (LIPITOR) 20 MG tablet TAKE 1 TABLET BY MOUTH EVERY DAY   famotidine (PEPCID) 20 MG tablet Take 20 mg by mouth daily.   folic acid (FOLVITE) 1 MG tablet Take 1 mg by mouth daily.   glucose blood (ONETOUCH VERIO) test strip Use as instructed to test blood glucose one time daily   lisinopril (PRINIVIL,ZESTRIL) 20 MG tablet Take 20 mg by mouth daily.   metFORMIN (GLUCOPHAGE-XR) 500 MG 24 hr tablet Take 2 tablets (1,000 mg total) by mouth daily with breakfast.   methotrexate (RHEUMATREX) 2.5 MG tablet once a week. Patient is currently taking 8 tablets a week.   Omega-3 Fatty Acids (FISH OIL) 1000 MG CAPS Take by mouth daily.   PARoxetine (PAXIL) 20 MG tablet Take 20 mg by mouth daily.    Semaglutide, 2 MG/DOSE, 8 MG/3ML SOPN Inject 2 mg as directed once a week.   triamcinolone cream (KENALOG) 0.1 % Apply 1 application  topically as needed.   cholecalciferol (VITAMIN D3) 25 MCG (1000 UNIT) tablet Take 5,000 Units by mouth daily. (Patient not taking: Reported on 06/06/2022)   Continuous Blood Gluc Sensor (DEXCOM G7 SENSOR) MISC Inject 1 Application into the skin as directed. Change sensor every 10 days as directed. (Patient not taking: Reported on 03/18/2023)   No facility-administered encounter medications on file as of 03/18/2023.   ALLERGIES: No Known Allergies VACCINATION STATUS: Immunization History  Administered Date(s) Administered   Moderna Sars-Covid-2 Vaccination 04/05/2020, 05/03/2020     Diabetes She presents for her follow-up diabetic visit. She has type 2 diabetes mellitus. Onset time: She was diagnosed at approximate age of 42 years after episodes of gestational diabetes with 3 of her pregnancies from 6 until 2014. Her disease course has been improving. There are no hypoglycemic associated symptoms. Pertinent negatives for hypoglycemia include no confusion, headaches, pallor, sweats or tremors. Associated symptoms include weight loss. Pertinent negatives for diabetes include no chest pain, no fatigue, no polydipsia, no polyphagia and no polyuria. There are no hypoglycemic complications. Symptoms are stable. There  are no diabetic complications. Risk factors for coronary artery disease include diabetes mellitus, hypertension, obesity, sedentary lifestyle and dyslipidemia. Current diabetic treatment includes oral agent (monotherapy) (and ozempic). She is compliant with treatment most of the time. Her weight is decreasing steadily. She is following a diabetic diet. When asked about meal planning, she reported none. She has not had a previous visit with a dietitian. She participates in exercise intermittently. Her home blood glucose trend is decreasing steadily. Her breakfast  blood glucose range is generally 130-140 mg/dl. (She presents today with her meter, no logs, showing at goal glycemic profile overall.  Her POCT A1c today is 6.4%, improving from last visit of 7%.  She continues to lose weight and generally feels well overall.  She denies any hypoglycemia.) An ACE inhibitor/angiotensin II receptor blocker is being taken. She does not see a podiatrist.Eye exam is current.  Hypertension This is a chronic problem. The current episode started more than 1 year ago. The problem has been resolved since onset. The problem is controlled. Pertinent negatives include no chest pain, headaches, palpitations, shortness of breath or sweats. There are no associated agents to hypertension. Risk factors for coronary artery disease include diabetes mellitus, obesity, sedentary lifestyle and dyslipidemia. Past treatments include ACE inhibitors. The current treatment provides moderate improvement. There are no compliance problems.  Identifiable causes of hypertension include chronic renal disease.  Hyperlipidemia This is a chronic problem. The current episode started more than 1 year ago. The problem is uncontrolled. Recent lipid tests were reviewed and are high. Exacerbating diseases include chronic renal disease, diabetes and obesity. There are no known factors aggravating her hyperlipidemia. Pertinent negatives include no chest pain, myalgias or shortness of breath. Current antihyperlipidemic treatment includes statins. The current treatment provides moderate improvement of lipids. Compliance problems: sometimes forgets to take her cholesterol pill.  Risk factors for coronary artery disease include diabetes mellitus, dyslipidemia, hypertension, obesity and a sedentary lifestyle.     Review of systems  Constitutional: + decreasing body weight,  current Body mass index is 32.54 kg/m. , no fatigue, no subjective hyperthermia, no subjective hypothermia Eyes: no blurry vision, no  xerophthalmia ENT: no sore throat, no nodules palpated in throat, no dysphagia/odynophagia, no hoarseness Cardiovascular: no chest pain, no shortness of breath, no palpitations, no leg swelling Respiratory: no cough, no shortness of breath Gastrointestinal: no nausea/vomiting/diarrhea Musculoskeletal: no muscle/joint aches Skin: no rashes, no hyperemia Neurological: no tremors, no numbness, no tingling, no dizziness Psychiatric: no depression, no anxiety   Objective:    BP 112/76 (BP Location: Left Arm, Patient Position: Sitting, Cuff Size: Large)   Pulse 81   Ht 5\' 6"  (1.676 m)   Wt 201 lb 9.6 oz (91.4 kg)   BMI 32.54 kg/m   Wt Readings from Last 3 Encounters:  03/18/23 201 lb 9.6 oz (91.4 kg)  11/06/22 210 lb 3.2 oz (95.3 kg)  06/06/22 228 lb (103.4 kg)    BP Readings from Last 3 Encounters:  03/18/23 112/76  11/06/22 120/74  06/06/22 119/81      Physical Exam- Limited  Constitutional:  Body mass index is 32.54 kg/m. , not in acute distress, normal state of mind Eyes:  EOMI, no exophthalmos Musculoskeletal: no gross deformities, strength intact in all four extremities, no gross restriction of joint movements Skin:  no rashes, no hyperemia Neurological: no tremor with outstretched hands   Diabetic Foot Exam - Simple   Simple Foot Form Diabetic Foot exam was performed with the following findings: Yes 03/18/2023  8:24 AM  Visual Inspection No deformities, no ulcerations, no other skin breakdown bilaterally: Yes Sensation Testing Intact to touch and monofilament testing bilaterally: Yes Pulse Check Posterior Tibialis and Dorsalis pulse intact bilaterally: Yes Comments      Recent Results (from the past 2160 hour(s))  VITAMIN D 25 Hydroxy (Vit-D Deficiency, Fractures)     Status: None   Collection Time: 03/12/23 12:00 AM  Result Value Ref Range   Vit D, 25-Hydroxy 27   Basic metabolic panel     Status: None   Collection Time: 03/12/23 12:00 AM  Result Value  Ref Range   Glucose 140    BUN 12 4 - 21   Creatinine 0.7 0.5 - 1.1  Comprehensive metabolic panel     Status: None   Collection Time: 03/12/23 12:00 AM  Result Value Ref Range   eGFR 103   Lipid panel     Status: None   Collection Time: 03/12/23 12:00 AM  Result Value Ref Range   Triglycerides 90 40 - 160   LDL Cholesterol 91   TSH     Status: None   Collection Time: 03/12/23 12:00 AM  Result Value Ref Range   TSH 2.88 0.41 - 5.90    Comment: FT4-0.66  POCT UA - Microalbumin     Status: Abnormal   Collection Time: 03/18/23  8:22 AM  Result Value Ref Range   Microalbumin Ur, POC 10 mg/L mg/L   Creatinine, POC 50 mg/dL mg/dL   Albumin/Creatinine Ratio, Urine, POC 30-300 mg/G   HgB A1c     Status: Abnormal   Collection Time: 03/18/23  8:22 AM  Result Value Ref Range   Hemoglobin A1C 6.4 (A) 4.0 - 5.6 %   HbA1c POC (<> result, manual entry)     HbA1c, POC (prediabetic range)     HbA1c, POC (controlled diabetic range)      Lipid Panel     Component Value Date/Time   CHOL 201 (A) 11/21/2020 0000   TRIG 90 03/12/2023 0000   HDL 50 07/14/2018 0000   LDLCALC 91 03/12/2023 0000      Assessment & Plan:   1) Controlled type 2 diabetes mellitus with complication, without long-term current use of insulin (HCC)  - Patient has currently uncontrolled symptomatic type 2 DM since 52 years of age.  She presents today with her meter, no logs, showing at goal glycemic profile overall.  Her POCT A1c today is 6.4%, improving from last visit of 7%.  She continues to lose weight and generally feels well overall.  She denies any hypoglycemia.  -I reviewed her recent labs which include normal renal function.     Her diabetes is complicated by obesity/sedentary life and patient remains at a high risk for more acute and chronic complications of diabetes which include CAD, CVA, CKD, retinopathy, and neuropathy. These are all discussed in detail with the patient.  - Nutritional counseling  repeated at each appointment due to patients tendency to fall back in to old habits.  - The patient admits there is a room for improvement in their diet and drink choices. -  Suggestion is made for the patient to avoid simple carbohydrates from their diet including Cakes, Sweet Desserts / Pastries, Ice Cream, Soda (diet and regular), Sweet Tea, Candies, Chips, Cookies, Sweet Pastries, Store Bought Juices, Alcohol in Excess of 1-2 drinks a day, Artificial Sweeteners, Coffee Creamer, and "Sugar-free" Products. This will help patient to have stable blood glucose profile and potentially avoid  unintended weight gain.   - I encouraged the patient to switch to unprocessed or minimally processed complex starch and increased protein intake (animal or plant source), fruits, and vegetables.   - Patient is advised to stick to a routine mealtimes to eat 3 meals a day and avoid unnecessary snacks (to snack only to correct hypoglycemia).  -She is agreeable to seeing Norm Salt, RDE for a diabetes education.  - I have approached patient with the following individualized plan to manage diabetes and patient agrees:   -She is advised to continue her Ozempic 2 mg SQ weekly and Metformin 1000 mg ER daily with breakfast.   -She is encouraged to continue monitoring blood glucose at least once daily, before breakfast maybe 3 times a week to keep an eye on how well she is doing.  2) BP/HTN: Her blood pressure is controlled to target.  She is advised to continue Lisinopril 20 mg p.o. daily.   3) Lipids/HPL:  Her most recent lipid panel from 03/12/23 shows controlled LDL of 90 (improved).    She is advised to continue Atorvastatin 20 mg po daily at bedtime.  Side effects and precautions discussed with her.    4)  Weight/Diet:  Her Body mass index is 32.54 kg/m.-- clearly complicating her DM management.  She is a candidate for modest weight loss.  CDE Consult in progress, exercise, and detailed carbohydrates  information provided.     5) Chronic Care/Health Maintenance: -Patient is on ACEI and statin encouraged to continue to follow up with Ophthalmology, Podiatrist at least yearly or according to recommendations, and advised to stay away from smoking. I have recommended yearly flu vaccine and pneumonia vaccination at least every 5 years; moderate intensity exercise for up to 150 minutes weekly; and  sleep for at least 7 hours a day.  - I advised patient to maintain close follow up with for primary care needs.      I spent  28  minutes in the care of the patient today including review of labs from CMP, Lipids, Thyroid Function, Hematology (current and previous including abstractions from other facilities); face-to-face time discussing  her blood glucose readings/logs, discussing hypoglycemia and hyperglycemia episodes and symptoms, medications doses, her options of short and long term treatment based on the latest standards of care / guidelines;  discussion about incorporating lifestyle medicine;  and documenting the encounter. Risk reduction counseling performed per USPSTF guidelines to reduce obesity and cardiovascular risk factors.     Please refer to Patient Instructions for Blood Glucose Monitoring and Insulin/Medications Dosing Guide"  in media tab for additional information. Please  also refer to " Patient Self Inventory" in the Media  tab for reviewed elements of pertinent patient history.  Stacy Clarke participated in the discussions, expressed understanding, and voiced agreement with the above plans.  All questions were answered to her satisfaction. she is encouraged to contact clinic should she have any questions or concerns prior to her return visit.    Follow up plan: - Return in about 4 months (around 07/18/2023) for Diabetes F/U with A1c in office, No previsit labs.  Ronny Bacon, [facility] [address] [place], [place] [address] Phone:  [phone] Fax: [phone]  03/18/2023, 8:27 AM

## 2023-06-24 ENCOUNTER — Other Ambulatory Visit: Payer: Self-pay | Admitting: Nurse Practitioner

## 2023-07-03 ENCOUNTER — Other Ambulatory Visit: Payer: Self-pay | Admitting: Nurse Practitioner

## 2023-07-22 ENCOUNTER — Encounter: Payer: Self-pay | Admitting: Nurse Practitioner

## 2023-07-22 ENCOUNTER — Ambulatory Visit: Payer: 59 | Admitting: Nurse Practitioner

## 2023-07-22 VITALS — BP 94/62 | HR 88 | Ht 66.0 in | Wt 203.4 lb

## 2023-07-22 DIAGNOSIS — Z7985 Long-term (current) use of injectable non-insulin antidiabetic drugs: Secondary | ICD-10-CM | POA: Diagnosis not present

## 2023-07-22 DIAGNOSIS — I10 Essential (primary) hypertension: Secondary | ICD-10-CM | POA: Diagnosis not present

## 2023-07-22 DIAGNOSIS — E119 Type 2 diabetes mellitus without complications: Secondary | ICD-10-CM | POA: Diagnosis not present

## 2023-07-22 DIAGNOSIS — E782 Mixed hyperlipidemia: Secondary | ICD-10-CM | POA: Diagnosis not present

## 2023-07-22 DIAGNOSIS — Z7984 Long term (current) use of oral hypoglycemic drugs: Secondary | ICD-10-CM

## 2023-07-22 LAB — POCT GLYCOSYLATED HEMOGLOBIN (HGB A1C): HbA1c POC (<> result, manual entry): 6.5 % (ref 4.0–5.6)

## 2023-07-22 MED ORDER — METFORMIN HCL ER 500 MG PO TB24: 1000.0000 mg | ORAL_TABLET | Freq: Every day | ORAL | 3 refills | Status: DC

## 2023-07-22 MED ORDER — SEMAGLUTIDE (2 MG/DOSE) 8 MG/3ML ~~LOC~~ SOPN: 2.0000 mg | PEN_INJECTOR | SUBCUTANEOUS | 3 refills | Status: DC

## 2023-07-22 NOTE — Progress Notes (Signed)
07/22/2023  Endocrinology follow-up note   Subjective:    Patient ID: Stacy Clarke, female    DOB: 06/25/1971. Patient is being seen in follow-up for management of type 2  Diabetes, HTN requested by  Joaquin Courts, DO  Past Medical History:  Diagnosis Date   HTN (hypertension) 08/28/2012    Social History   Socioeconomic History   Marital status: Married    Spouse name: Not on file   Number of children: Not on file   Years of education: Not on file   Highest education level: Not on file  Occupational History   Not on file  Tobacco Use   Smoking status: Former   Smokeless tobacco: Never  Vaping Use   Vaping status: Never Used  Substance and Sexual Activity   Alcohol use: No   Drug use: No   Sexual activity: Not on file  Other Topics Concern   Not on file  Social History Narrative   Not on file   Social Determinants of Health   Financial Resource Strain: Low Risk  (06/11/2018)   Received from [facility], [facility]   Overall Financial Resource Strain (CARDIA)    Difficulty of Paying Living Expenses: Not hard at all  Food Insecurity: No Food Insecurity (06/11/2018)   Received from [facility], [facility]   Hunger Vital Sign    Worried About Running Out of Food in the Last Year: Never true    Ran Out of Food in the Last Year: Never true  Transportation Needs: No Transportation Needs (06/11/2018)   Received from [facility], [facility]   [facility] - Transportation    Lack of Transportation (Medical): No    Lack of Transportation (Non-Medical): No  Physical Activity: Inactive (09/18/2021)   Received from [facility], [facility]   Exercise Vital Sign    Days of Exercise per Week: 0 days    Minutes of Exercise per Session: 0 min  Stress: No Stress Concern Present (09/18/2021)   Received from [facility], [facility] - Occupational Stress Questionnaire     Feeling of Stress : Not at all  Social Connections: Unknown (02/12/2022)   Received from [facility], [facility]   Social Network    Social Network: Not on file   Outpatient Encounter Medications as of 07/22/2023  Medication Sig   aspirin 81 MG EC tablet Take 81 mg by mouth daily. Swallow whole.   atorvastatin (LIPITOR) 20 MG tablet TAKE 1 TABLET BY MOUTH EVERY DAY   cholecalciferol (VITAMIN D3) 25 MCG (1000 UNIT) tablet Take 5,000 Units by mouth daily.   famotidine (PEPCID) 20 MG tablet Take 20 mg by mouth daily.   folic acid (FOLVITE) 1 MG tablet Take 1 mg by mouth daily.   lisinopril (PRINIVIL,ZESTRIL) 20 MG tablet Take 20 mg by mouth daily. Take 10 mg po daily (1/2 of her current 20 mg tabs)   methotrexate (RHEUMATREX) 2.5 MG tablet once a week. Patient is currently taking 8 tablets a week.   Omega-3 Fatty Acids (FISH OIL) 1000 MG CAPS Take by mouth daily.   ONETOUCH VERIO test strip USE AS INSTRUCTED TO TEST BLOOD GLUCOSE ONE TIME DAILY   PARoxetine (PAXIL) 20 MG tablet Take 20 mg by mouth daily.   triamcinolone cream (KENALOG) 0.1 % Apply 1 application  topically as needed.   [  DISCONTINUED] metFORMIN (GLUCOPHAGE-XR) 500 MG 24 hr tablet Take 2 tablets (1,000 mg total) by mouth daily with breakfast.   [DISCONTINUED] Semaglutide, 2 MG/DOSE, 8 MG/3ML SOPN Inject 2 mg as directed once a week.   Continuous Blood Gluc Sensor (DEXCOM G7 SENSOR) MISC Inject 1 Application into the skin as directed. Change sensor every 10 days as directed. (Patient not taking: Reported on 03/18/2023)   metFORMIN (GLUCOPHAGE-XR) 500 MG 24 hr tablet Take 2 tablets (1,000 mg total) by mouth daily with breakfast.   Semaglutide, 2 MG/DOSE, 8 MG/3ML SOPN Inject 2 mg as directed once a week.   No facility-administered encounter medications on file as of 07/22/2023.   ALLERGIES: No Known Allergies VACCINATION STATUS: Immunization History  Administered Date(s) Administered   Moderna Sars-Covid-2 Vaccination  04/05/2020, 05/03/2020     Diabetes She presents for her follow-up diabetic visit. She has type 2 diabetes mellitus. Onset time: She was diagnosed at approximate age of 45 years after episodes of gestational diabetes with 3 of her pregnancies from 72 until 2014. Her disease course has been stable. Pertinent negatives for hypoglycemia include no confusion, headaches, pallor or sweats. Pertinent negatives for diabetes include no chest pain, no fatigue, no polydipsia, no polyphagia, no polyuria and no weight loss. There are no hypoglycemic complications. Symptoms are stable. There are no diabetic complications. Risk factors for coronary artery disease include diabetes mellitus, hypertension, obesity, sedentary lifestyle and dyslipidemia. Current diabetic treatment includes oral agent (monotherapy) (and ozempic). She is compliant with treatment most of the time. Her weight is fluctuating minimally. She is following a diabetic diet. When asked about meal planning, she reported none. She has not had a previous visit with a dietitian. She participates in exercise intermittently. Her home blood glucose trend is fluctuating minimally. Her overall blood glucose range is 110-130 mg/dl. (She presents today with her meter, no logs, showing at goal glycemic profile overall.  Her POCT A1c today is 6.5%, essentially unchanged from previous visit.  Analysis of her meter shows glucose ranging between 97-143.) An ACE inhibitor/angiotensin II receptor blocker is being taken. She does not see a podiatrist.Eye exam is current.  Hypertension This is a chronic problem. The current episode started more than 1 year ago. The problem has been resolved since onset. The problem is controlled. Pertinent negatives include no chest pain, headaches, palpitations, shortness of breath or sweats. There are no associated agents to hypertension. Risk factors for coronary artery disease include diabetes mellitus, obesity, sedentary lifestyle and  dyslipidemia. Past treatments include ACE inhibitors. The current treatment provides moderate improvement. There are no compliance problems.  Identifiable causes of hypertension include chronic renal disease.  Hyperlipidemia This is a chronic problem. The current episode started more than 1 year ago. The problem is uncontrolled. Recent lipid tests were reviewed and are high. Exacerbating diseases include chronic renal disease, diabetes and obesity. There are no known factors aggravating her hyperlipidemia. Pertinent negatives include no chest pain, myalgias or shortness of breath. Current antihyperlipidemic treatment includes statins. The current treatment provides moderate improvement of lipids. Compliance problems: sometimes forgets to take her cholesterol pill.  Risk factors for coronary artery disease include diabetes mellitus, dyslipidemia, hypertension, obesity and a sedentary lifestyle.     Review of systems  Constitutional: + stable body weight,  current Body mass index is 32.83 kg/m. , no fatigue, no subjective hyperthermia, no subjective hypothermia Eyes: no blurry vision, no xerophthalmia ENT: no sore throat, no nodules palpated in throat, no dysphagia/odynophagia, no hoarseness Cardiovascular: no chest  pain, no shortness of breath, no palpitations, no leg swelling Respiratory: no cough, no shortness of breath Gastrointestinal: no nausea/vomiting/diarrhea Musculoskeletal: no muscle/joint aches Skin: no rashes, no hyperemia Neurological: no tremors, no numbness, no tingling, no dizziness Psychiatric: no depression, no anxiety   Objective:    BP 94/62 (BP Location: Right Arm, Patient Position: Sitting, Cuff Size: Large)   Pulse 88   Ht 5\' 6"  (1.676 m)   Wt 203 lb 6.4 oz (92.3 kg)   BMI 32.83 kg/m   Wt Readings from Last 3 Encounters:  07/22/23 203 lb 6.4 oz (92.3 kg)  03/18/23 201 lb 9.6 oz (91.4 kg)  11/06/22 210 lb 3.2 oz (95.3 kg)    BP Readings from Last 3 Encounters:   07/22/23 94/62  03/18/23 112/76  11/06/22 120/74     Physical Exam- Limited  Constitutional:  Body mass index is 32.83 kg/m. , not in acute distress, normal state of mind Eyes:  EOMI, no exophthalmos Musculoskeletal: no gross deformities, strength intact in all four extremities, no gross restriction of joint movements Skin:  no rashes, no hyperemia Neurological: no tremor with outstretched hands   Diabetic Foot Exam - Simple   No data filed      Recent Results (from the past 2160 hour(s))  HgB A1c     Status: Abnormal   Collection Time: 07/22/23  8:55 AM  Result Value Ref Range   Hemoglobin A1C     HbA1c POC (<> result, manual entry) 6.5 4.0 - 5.6 %   HbA1c, POC (prediabetic range)     HbA1c, POC (controlled diabetic range)       Lipid Panel     Component Value Date/Time   CHOL 201 (A) 11/21/2020 0000   TRIG 90 03/12/2023 0000   HDL 50 07/14/2018 0000   LDLCALC 91 03/12/2023 0000      Assessment & Plan:   1) Controlled type 2 diabetes mellitus with complication, without long-term current use of insulin (HCC)  - Patient has currently uncontrolled symptomatic type 2 DM since 52 years of age.  She presents today with her meter, no logs, showing at goal glycemic profile overall.  Her POCT A1c today is 6.5%, essentially unchanged from previous visit.  Analysis of her meter shows glucose ranging between 97-143.  -I reviewed her recent labs which include normal renal function.     Her diabetes is complicated by obesity/sedentary life and patient remains at a high risk for more acute and chronic complications of diabetes which include CAD, CVA, CKD, retinopathy, and neuropathy. These are all discussed in detail with the patient.  - Nutritional counseling repeated at each appointment due to patients tendency to fall back in to old habits.  - The patient admits there is a room for improvement in their diet and drink choices. -  Suggestion is made for the patient to  avoid simple carbohydrates from their diet including Cakes, Sweet Desserts / Pastries, Ice Cream, Soda (diet and regular), Sweet Tea, Candies, Chips, Cookies, Sweet Pastries, Store Bought Juices, Alcohol in Excess of 1-2 drinks a day, Artificial Sweeteners, Coffee Creamer, and "Sugar-free" Products. This will help patient to have stable blood glucose profile and potentially avoid unintended weight gain.   - I encouraged the patient to switch to unprocessed or minimally processed complex starch and increased protein intake (animal or plant source), fruits, and vegetables.   - Patient is advised to stick to a routine mealtimes to eat 3 meals a day and avoid unnecessary snacks (  to snack only to correct hypoglycemia).  -She is agreeable to seeing Norm Salt, RDE for a diabetes education.  - I have approached patient with the following individualized plan to manage diabetes and patient agrees:   -Given her stable at goal glycemic profile, no changes will be made to her medications today.  She is advised to continue her Ozempic 2 mg SQ weekly and Metformin 1000 mg ER daily with breakfast.   -She is encouraged to continue monitoring blood glucose at least once daily, before breakfast maybe 3 times a week to keep an eye on how well she is doing.  Her insurance denied Dexcom approval.  I did give her the idea of Stelo, the OTC CGM.  2) BP/HTN: Her blood pressure is slightly low today.  She is advised to lower her Lisinopril to 10 mg p.o. daily (may take 0.5 of her current 20 mg tabs).   3) Lipids/HPL:  Her most recent lipid panel from 03/12/23 shows controlled LDL of 90 (improved).    She is advised to continue Atorvastatin 20 mg po daily at bedtime.  Side effects and precautions discussed with her.    4)  Weight/Diet:  Her Body mass index is 32.83 kg/m.-- clearly complicating her DM management.  She is a candidate for modest weight loss.  CDE Consult in progress, exercise, and detailed carbohydrates  information provided.     5) Chronic Care/Health Maintenance: -Patient is on ACEI and statin encouraged to continue to follow up with Ophthalmology, Podiatrist at least yearly or according to recommendations, and advised to stay away from smoking. I have recommended yearly flu vaccine and pneumonia vaccination at least every 5 years; moderate intensity exercise for up to 150 minutes weekly; and  sleep for at least 7 hours a day.  - I advised patient to maintain close follow up with for primary care needs.     I spent  35  minutes in the care of the patient today including review of labs from CMP, Lipids, Thyroid Function, Hematology (current and previous including abstractions from other facilities); face-to-face time discussing  her blood glucose readings/logs, discussing hypoglycemia and hyperglycemia episodes and symptoms, medications doses, her options of short and long term treatment based on the latest standards of care / guidelines;  discussion about incorporating lifestyle medicine;  and documenting the encounter. Risk reduction counseling performed per USPSTF guidelines to reduce obesity and cardiovascular risk factors.     Please refer to Patient Instructions for Blood Glucose Monitoring and Insulin/Medications Dosing Guide"  in media tab for additional information. Please  also refer to " Patient Self Inventory" in the Media  tab for reviewed elements of pertinent patient history.  Shajuan Accardi participated in the discussions, expressed understanding, and voiced agreement with the above plans.  All questions were answered to her satisfaction. she is encouraged to contact clinic should she have any questions or concerns prior to her return visit.    Follow up plan: - Return in about 4 months (around 11/22/2023) for Diabetes F/U with A1c in office, No previsit labs.  Ronny Bacon, [facility] [address] [place], [place] [address] Phone:  [phone] Fax: [phone]  07/22/2023, 9:13 AM

## 2023-11-03 ENCOUNTER — Other Ambulatory Visit: Payer: Self-pay | Admitting: Nurse Practitioner

## 2023-11-24 ENCOUNTER — Ambulatory Visit: Payer: 59 | Admitting: Nurse Practitioner

## 2023-11-24 ENCOUNTER — Encounter: Payer: Self-pay | Admitting: Nurse Practitioner

## 2023-11-24 VITALS — BP 118/74 | HR 79 | Ht 66.0 in | Wt 201.6 lb

## 2023-11-24 DIAGNOSIS — Z7985 Long-term (current) use of injectable non-insulin antidiabetic drugs: Secondary | ICD-10-CM

## 2023-11-24 DIAGNOSIS — Z7984 Long term (current) use of oral hypoglycemic drugs: Secondary | ICD-10-CM | POA: Diagnosis not present

## 2023-11-24 DIAGNOSIS — E119 Type 2 diabetes mellitus without complications: Secondary | ICD-10-CM | POA: Diagnosis not present

## 2023-11-24 DIAGNOSIS — E782 Mixed hyperlipidemia: Secondary | ICD-10-CM | POA: Diagnosis not present

## 2023-11-24 DIAGNOSIS — I10 Essential (primary) hypertension: Secondary | ICD-10-CM

## 2023-11-24 LAB — POCT GLYCOSYLATED HEMOGLOBIN (HGB A1C): Hemoglobin A1C: 6.6 % — AB (ref 4.0–5.6)

## 2023-11-24 MED ORDER — TIRZEPATIDE 7.5 MG/0.5ML ~~LOC~~ SOAJ: 7.5000 mg | SUBCUTANEOUS | 1 refills | Status: DC

## 2023-11-24 NOTE — Progress Notes (Signed)
 11/24/2023  Endocrinology follow-up note   Subjective:    Patient ID: Stacy Clarke, female    DOB: Feb 15, 1971. Patient is being seen in follow-up for management of type 2  Diabetes, HTN requested by  Joaquin Courts, DO  Past Medical History:  Diagnosis Date   HTN (hypertension) 08/28/2012    Social History   Socioeconomic History   Marital status: Married    Spouse name: Not on file   Number of children: Not on file   Years of education: Not on file   Highest education level: Not on file  Occupational History   Not on file  Tobacco Use   Smoking status: Former   Smokeless tobacco: Never  Vaping Use   Vaping status: Never Used  Substance and Sexual Activity   Alcohol use: No   Drug use: No   Sexual activity: Not on file  Other Topics Concern   Not on file  Social History Narrative   Not on file   Social Drivers of Health   Financial Resource Strain: Low Risk  (06/11/2018)   Received from [facility], [facility]   Overall Financial Resource Strain (CARDIA)    Difficulty of Paying Living Expenses: Not hard at all  Food Insecurity: No Food Insecurity (06/11/2018)   Received from [facility], [facility]   Hunger Vital Sign    Worried About Running Out of Food in the Last Year: Never true    Ran Out of Food in the Last Year: Never true  Transportation Needs: No Transportation Needs (06/11/2018)   Received from [facility], [facility]   [facility] - Transportation    Lack of Transportation (Medical): No    Lack of Transportation (Non-Medical): No  Physical Activity: Inactive (09/18/2021)   Received from [facility], [facility]   Exercise Vital Sign    Days of Exercise per Week: 0 days    Minutes of Exercise per Session: 0 min  Stress: No Stress Concern Present (09/18/2021)   Received from [facility], [facility] - Occupational Stress Questionnaire    Feeling of  Stress : Not at all  Social Connections: Unknown (02/12/2022)   Received from [facility], [facility]   Social Network    Social Network: Not on file   Outpatient Encounter Medications as of 11/24/2023  Medication Sig   famotidine (PEPCID) 20 MG tablet Take 20 mg by mouth daily.   folic acid (FOLVITE) 1 MG tablet Take 1 mg by mouth daily.   lisinopril (PRINIVIL,ZESTRIL) 20 MG tablet Take 20 mg by mouth daily. Take 10 mg po daily (1/2 of her current 20 mg tabs)   metFORMIN (GLUCOPHAGE-XR) 500 MG 24 hr tablet Take 2 tablets (1,000 mg total) by mouth daily with breakfast.   methotrexate (RHEUMATREX) 2.5 MG tablet once a week. Patient is currently taking 8 tablets a week.   Omega-3 Fatty Acids (FISH OIL) 1000 MG CAPS Take by mouth daily.   ONETOUCH VERIO test strip USE AS INSTRUCTED TO TEST BLOOD GLUCOSE ONE TIME DAILY   PARoxetine (PAXIL) 20 MG tablet Take 20 mg by mouth daily.   tirzepatide (MOUNJARO) 7.5 MG/0.5ML Pen Inject 7.5 mg into the skin once a week.   triamcinolone cream (KENALOG) 0.1 % Apply 1 application  topically as needed.   [DISCONTINUED] Semaglutide, 2 MG/DOSE, 8 MG/3ML SOPN Inject  2 mg as directed once a week.   aspirin 81 MG EC tablet Take 81 mg by mouth daily. Swallow whole.   atorvastatin (LIPITOR) 20 MG tablet TAKE 1 TABLET BY MOUTH EVERY DAY   cholecalciferol (VITAMIN D3) 25 MCG (1000 UNIT) tablet Take 5,000 Units by mouth daily.   Continuous Blood Gluc Sensor (DEXCOM G7 SENSOR) MISC Inject 1 Application into the skin as directed. Change sensor every 10 days as directed. (Patient not taking: Reported on 03/18/2023)   No facility-administered encounter medications on file as of 11/24/2023.   ALLERGIES: No Known Allergies VACCINATION STATUS: Immunization History  Administered Date(s) Administered   Moderna Sars-Covid-2 Vaccination 04/05/2020, 05/03/2020     Diabetes She presents for her follow-up diabetic visit. She has type 2 diabetes mellitus. Onset time: She  was diagnosed at approximate age of 29 years after episodes of gestational diabetes with 3 of her pregnancies from 81 until 2014. Her disease course has been stable. Pertinent negatives for hypoglycemia include no confusion, headaches, pallor or sweats. Pertinent negatives for diabetes include no chest pain, no fatigue, no polydipsia, no polyphagia, no polyuria and no weight loss. There are no hypoglycemic complications. Symptoms are stable. There are no diabetic complications. Risk factors for coronary artery disease include diabetes mellitus, hypertension, obesity, sedentary lifestyle and dyslipidemia. Current diabetic treatment includes oral agent (monotherapy) (and ozempic). She is compliant with treatment most of the time. Her weight is fluctuating minimally. She is following a diabetic diet. When asked about meal planning, she reported none. She has not had a previous visit with a dietitian. She participates in exercise intermittently. Her home blood glucose trend is fluctuating minimally. Her overall blood glucose range is 110-130 mg/dl. (She presents today with her meter, no logs, showing at goal glycemic profile overall.  Her POCT A1c today is 6.6%, essentially unchanged from previous visit.  Analysis of her meter shows glucose ranging between 103-211.  She is inquiring about switching to [facility], says her husband had done really well on this and thinks maybe she would too.) An ACE inhibitor/angiotensin II receptor blocker is being taken. She does not see a podiatrist.Eye exam is current.  Hypertension This is a chronic problem. The current episode started more than 1 year ago. The problem has been resolved since onset. The problem is controlled. Pertinent negatives include no chest pain, headaches, palpitations, shortness of breath or sweats. There are no associated agents to hypertension. Risk factors for coronary artery disease include diabetes mellitus, obesity, sedentary lifestyle and  dyslipidemia. Past treatments include ACE inhibitors. The current treatment provides moderate improvement. There are no compliance problems.  Identifiable causes of hypertension include chronic renal disease.  Hyperlipidemia This is a chronic problem. The current episode started more than 1 year ago. The problem is uncontrolled. Recent lipid tests were reviewed and are high. Exacerbating diseases include chronic renal disease, diabetes and obesity. There are no known factors aggravating her hyperlipidemia. Pertinent negatives include no chest pain, myalgias or shortness of breath. Current antihyperlipidemic treatment includes statins. The current treatment provides moderate improvement of lipids. Compliance problems: sometimes forgets to take her cholesterol pill.  Risk factors for coronary artery disease include diabetes mellitus, dyslipidemia, hypertension, obesity and a sedentary lifestyle.    Review of systems  Constitutional: + Minimally fluctuating body weight,  current Body mass index is 32.54 kg/m. , no fatigue, no subjective hyperthermia, no subjective hypothermia Eyes: no blurry vision, no xerophthalmia ENT: no sore throat, no nodules palpated in throat, no dysphagia/odynophagia, no hoarseness  Cardiovascular: no chest pain, no shortness of breath, no palpitations, no leg swelling Respiratory: no cough, no shortness of breath Gastrointestinal: no nausea/vomiting/diarrhea Musculoskeletal: no muscle/joint aches Skin: no rashes, no hyperemia Neurological: no tremors, no numbness, no tingling, no dizziness Psychiatric: no depression, no anxiety   Objective:    BP 118/74 (BP Location: Left Arm, Patient Position: Sitting)   Pulse 79   Ht 5\' 6"  (1.676 m)   Wt 201 lb 9.6 oz (91.4 kg)   BMI 32.54 kg/m   Wt Readings from Last 3 Encounters:  11/24/23 201 lb 9.6 oz (91.4 kg)  07/22/23 203 lb 6.4 oz (92.3 kg)  03/18/23 201 lb 9.6 oz (91.4 kg)    BP Readings from Last 3 Encounters:   11/24/23 118/74  07/22/23 94/62  03/18/23 112/76     Physical Exam- Limited  Constitutional:  Body mass index is 32.54 kg/m. , not in acute distress, normal state of mind Eyes:  EOMI, no exophthalmos Musculoskeletal: no gross deformities, strength intact in all four extremities, no gross restriction of joint movements Skin:  no rashes, no hyperemia Neurological: no tremor with outstretched hands   Diabetic Foot Exam - Simple   No data filed      Recent Results (from the past 2160 hours)  HgB A1c     Status: Abnormal   Collection Time: 11/24/23  8:53 AM  Result Value Ref Range   Hemoglobin A1C 6.6 (A) 4.0 - 5.6 %   HbA1c POC (<> result, manual entry)     HbA1c, POC (prediabetic range)     HbA1c, POC (controlled diabetic range)        Lipid Panel     Component Value Date/Time   CHOL 201 (A) 11/21/2020 0000   TRIG 90 03/12/2023 0000   HDL 50 07/14/2018 0000   LDLCALC 91 03/12/2023 0000      Assessment & Plan:   1) Controlled type 2 diabetes mellitus with complication, without long-term current use of insulin (HCC)  - Patient has currently uncontrolled symptomatic type 2 DM since 53 years of age.  She presents today with her meter, no logs, showing at goal glycemic profile overall.  Her POCT A1c today is 6.6%, essentially unchanged from previous visit.  Analysis of her meter shows glucose ranging between 103-211.  She is inquiring about switching to [facility], says her husband had done really well on this and thinks maybe she would too.  -I reviewed her recent labs which include normal renal function.     Her diabetes is complicated by obesity/sedentary life and patient remains at a high risk for more acute and chronic complications of diabetes which include CAD, CVA, CKD, retinopathy, and neuropathy. These are all discussed in detail with the patient.  - Nutritional counseling repeated at each appointment due to patients tendency to fall back in to old  habits.  - The patient admits there is a room for improvement in their diet and drink choices. -  Suggestion is made for the patient to avoid simple carbohydrates from their diet including Cakes, Sweet Desserts / Pastries, Ice Cream, Soda (diet and regular), Sweet Tea, Candies, Chips, Cookies, Sweet Pastries, Store Bought Juices, Alcohol in Excess of 1-2 drinks a day, Artificial Sweeteners, Coffee Creamer, and "Sugar-free" Products. This will help patient to have stable blood glucose profile and potentially avoid unintended weight gain.   - I encouraged the patient to switch to unprocessed or minimally processed complex starch and increased protein intake (animal or plant source),  fruits, and vegetables.   - Patient is advised to stick to a routine mealtimes to eat 3 meals a day and avoid unnecessary snacks (to snack only to correct hypoglycemia).  - I have approached patient with the following individualized plan to manage diabetes and patient agrees:   -She is advised to continue Metformin 1000 mg ER daily with breakfast. Will switch to Mounjaro 7.5 mg SQ weekly.  -She is encouraged to continue monitoring blood glucose at least once daily, before breakfast maybe 3 times a week to keep an eye on how well she is doing.  Her insurance denied Dexcom approval.  I did give her the idea of Stelo, the OTC CGM.  She notes her husbands insurance will pay for CGM therapy if she signs up for Diabetes Education program with them, which she plans to do.  2) BP/HTN: Her blood pressure is controlled to target.  She is advised to continue her Lisinopril 10 mg p.o. daily (may take 0.5 of her current 20 mg tabs).   3) Lipids/HPL:  Her most recent lipid panel from 03/12/23 shows controlled LDL of 90 (improved).    She is advised to continue Atorvastatin 20 mg po daily at bedtime.  Side effects and precautions discussed with her.    4)  Weight/Diet:  Her Body mass index is 32.54 kg/m.-- clearly complicating her  DM management.  She is a candidate for modest weight loss.  CDE Consult in progress, exercise, and detailed carbohydrates information provided.     5) Chronic Care/Health Maintenance: -Patient is on ACEI and statin encouraged to continue to follow up with Ophthalmology, Podiatrist at least yearly or according to recommendations, and advised to stay away from smoking. I have recommended yearly flu vaccine and pneumonia vaccination at least every 5 years; moderate intensity exercise for up to 150 minutes weekly; and  sleep for at least 7 hours a day.  - I advised patient to maintain close follow up with for primary care needs.     I spent  30  minutes in the care of the patient today including review of labs from CMP, Lipids, Thyroid Function, Hematology (current and previous including abstractions from other facilities); face-to-face time discussing  her blood glucose readings/logs, discussing hypoglycemia and hyperglycemia episodes and symptoms, medications doses, her options of short and long term treatment based on the latest standards of care / guidelines;  discussion about incorporating lifestyle medicine;  and documenting the encounter. Risk reduction counseling performed per USPSTF guidelines to reduce obesity and cardiovascular risk factors.     Please refer to Patient Instructions for Blood Glucose Monitoring and Insulin/Medications Dosing Guide"  in media tab for additional information. Please  also refer to " Patient Self Inventory" in the Media  tab for reviewed elements of pertinent patient history.  Waynette Marrocco participated in the discussions, expressed understanding, and voiced agreement with the above plans.  All questions were answered to her satisfaction. she is encouraged to contact clinic should she have any questions or concerns prior to her return visit.    Follow up plan: - Return in about 4 months (around 03/23/2024) for Diabetes F/U with A1c in office, Previsit labs,  Bring meter and logs.  Ronny Bacon, [facility] [address] [place], [place] [address] Phone: [phone] Fax: [phone]  11/24/2023, 9:06 AM

## 2023-12-22 ENCOUNTER — Encounter: Payer: Self-pay | Admitting: Nurse Practitioner

## 2023-12-28 ENCOUNTER — Other Ambulatory Visit: Payer: Self-pay | Admitting: Nurse Practitioner

## 2024-03-17 LAB — LAB REPORT - SCANNED
EGFR: 102
Free T4: 0.68 ng/dL
TSH: 2.54 (ref 0.41–5.90)

## 2024-03-23 ENCOUNTER — Ambulatory Visit: Payer: 59 | Admitting: Nurse Practitioner

## 2024-03-23 ENCOUNTER — Encounter: Payer: Self-pay | Admitting: Nurse Practitioner

## 2024-03-23 VITALS — BP 100/66 | HR 86 | Ht 66.0 in | Wt 188.4 lb

## 2024-03-23 DIAGNOSIS — I10 Essential (primary) hypertension: Secondary | ICD-10-CM | POA: Diagnosis not present

## 2024-03-23 DIAGNOSIS — E119 Type 2 diabetes mellitus without complications: Secondary | ICD-10-CM | POA: Diagnosis not present

## 2024-03-23 DIAGNOSIS — E782 Mixed hyperlipidemia: Secondary | ICD-10-CM

## 2024-03-23 DIAGNOSIS — Z7984 Long term (current) use of oral hypoglycemic drugs: Secondary | ICD-10-CM | POA: Diagnosis not present

## 2024-03-23 DIAGNOSIS — Z7985 Long-term (current) use of injectable non-insulin antidiabetic drugs: Secondary | ICD-10-CM

## 2024-03-23 DIAGNOSIS — E559 Vitamin D deficiency, unspecified: Secondary | ICD-10-CM

## 2024-03-23 LAB — POCT GLYCOSYLATED HEMOGLOBIN (HGB A1C): Hemoglobin A1C: 5.9 % — AB (ref 4.0–5.6)

## 2024-03-23 LAB — POCT UA - MICROALBUMIN
Creatinine, POC: 300 mg/dL
Microalbumin Ur, POC: 80 mg/L

## 2024-03-23 MED ORDER — METFORMIN HCL ER 500 MG PO TB24: 1000.0000 mg | ORAL_TABLET | Freq: Every day | ORAL | 3 refills | Status: DC

## 2024-03-23 MED ORDER — TIRZEPATIDE 7.5 MG/0.5ML ~~LOC~~ SOAJ: 7.5000 mg | SUBCUTANEOUS | 3 refills | Status: DC

## 2024-03-23 NOTE — Progress Notes (Signed)
 03/23/2024  Endocrinology follow-up note   Subjective:    Patient ID: Stacy Clarke, female    DOB: 1971-06-14. Patient is being seen in follow-up for management of type 2  Diabetes, HTN requested by  Levander Norleen Dover, DO  Past Medical History:  Diagnosis Date   HTN (hypertension) 08/28/2012    Social History   Socioeconomic History   Marital status: Married    Spouse name: Not on file   Number of children: Not on file   Years of education: Not on file   Highest education level: Not on file  Occupational History   Not on file  Tobacco Use   Smoking status: Former   Smokeless tobacco: Never  Vaping Use   Vaping status: Never Used  Substance and Sexual Activity   Alcohol use: No   Drug use: No   Sexual activity: Not on file  Other Topics Concern   Not on file  Social History Narrative   Not on file   Social Drivers of Health   Financial Resource Strain: Low Risk  (06/11/2018)   Received from [facility]   Overall Financial Resource Strain (CARDIA)    Difficulty of Paying Living Expenses: Not hard at all  Food Insecurity: No Food Insecurity (06/11/2018)   Received from [facility]   Hunger Vital Sign    Worried About Running Out of Food in the Last Year: Never true    Ran Out of Food in the Last Year: Never true  Transportation Needs: No Transportation Needs (06/11/2018)   Received from [facility]   PRAPARE - Transportation    Lack of Transportation (Medical): No    Lack of Transportation (Non-Medical): No  Physical Activity: Inactive (09/18/2021)   Received from [facility]   Exercise Vital Sign    On average, how many days per week do you engage in moderate to strenuous exercise (like a brisk walk)?: 0 days    On average, how many minutes do you engage in exercise at this level?: 0 min  Stress: No Stress Concern Present (09/18/2021)   Received from [facility] - Occupational  Stress Questionnaire    Feeling of Stress : Not at all  Social Connections: Unknown (02/12/2022)   Received from [facility]   Social Network    Social Network: Not on file   Outpatient Encounter Medications as of 03/23/2024  Medication Sig   aspirin 81 MG EC tablet Take 81 mg by mouth daily. Swallow whole.   atorvastatin  (LIPITOR) 20 MG tablet TAKE 1 TABLET BY MOUTH EVERY DAY   cholecalciferol (VITAMIN D3) 25 MCG (1000 UNIT) tablet Take 5,000 Units by mouth daily.   Continuous Glucose Sensor (FREESTYLE LIBRE 3 SENSOR) MISC by Does not apply route as directed. Sensor is changed every 15 days   famotidine (PEPCID) 20 MG tablet Take 20 mg by mouth daily.   folic acid (FOLVITE) 1 MG tablet Take 1 mg by mouth daily.   lisinopril (PRINIVIL,ZESTRIL) 20 MG tablet Take 20 mg by mouth daily. Take 10 mg po daily (1/2 of her current 20 mg tabs)   methotrexate (RHEUMATREX) 2.5 MG tablet once a week. Patient is currently taking 8 tablets a week.   Omega-3 Fatty Acids (FISH OIL) 1000 MG CAPS Take by mouth daily.   ONETOUCH VERIO test strip USE AS INSTRUCTED TO TEST BLOOD GLUCOSE ONE TIME DAILY  PARoxetine (PAXIL) 20 MG tablet Take 20 mg by mouth daily.   triamcinolone cream (KENALOG) 0.1 % Apply 1 application  topically as needed.   [DISCONTINUED] metFORMIN  (GLUCOPHAGE -XR) 500 MG 24 hr tablet Take 2 tablets (1,000 mg total) by mouth daily with breakfast.   [DISCONTINUED] tirzepatide  (MOUNJARO ) 7.5 MG/0.5ML Pen Inject 7.5 mg into the skin once a week.   Continuous Blood Gluc Sensor (DEXCOM G7 SENSOR) MISC Inject 1 Application into the skin as directed. Change sensor every 10 days as directed. (Patient not taking: Reported on 03/23/2024)   metFORMIN  (GLUCOPHAGE -XR) 500 MG 24 hr tablet Take 2 tablets (1,000 mg total) by mouth daily with breakfast.   tirzepatide  (MOUNJARO ) 7.5 MG/0.5ML Pen Inject 7.5 mg into the skin once a week.   No facility-administered encounter medications on file as of 03/23/2024.    ALLERGIES: No Known Allergies VACCINATION STATUS: Immunization History  Administered Date(s) Administered   Moderna Sars-Covid-2 Vaccination 04/05/2020, 05/03/2020     Diabetes She presents for her follow-up diabetic visit. She has type 2 diabetes mellitus. Onset time: She was diagnosed at approximate age of 24 years after episodes of gestational diabetes with 3 of her pregnancies from 89 until 2014. Her disease course has been improving. Pertinent negatives for hypoglycemia include no confusion, headaches, pallor or sweats. Associated symptoms include weight loss. Pertinent negatives for diabetes include no chest pain, no fatigue, no polydipsia, no polyphagia and no polyuria. There are no hypoglycemic complications. Symptoms are stable. There are no diabetic complications. Risk factors for coronary artery disease include diabetes mellitus, hypertension, obesity, sedentary lifestyle and dyslipidemia. Current diabetic treatment includes oral agent (monotherapy) (and Mounjaro ). She is compliant with treatment most of the time. Her weight is fluctuating minimally. She is following a diabetic diet. When asked about meal planning, she reported none. She has not had a previous visit with a dietitian. She participates in exercise intermittently. Her home blood glucose trend is decreasing steadily. Her overall blood glucose range is 110-130 mg/dl. (She presents today with her CGM showing at target glycemic profile.  Her POCT A1c today is 5.9%, improving from last visit of 6.6%.  Analysis of her CGM shows TIR 97%, TAR 3%, TBR 0% with a GMI of 6.3%.  She has lost 13 lbs since last visit and generally is feeling well.  She has tolerated the switch to Mounjaro  well, no unpleasant side effects.) An ACE inhibitor/angiotensin II receptor blocker is being taken. She does not see a podiatrist.Eye exam is current.  Hypertension This is a chronic problem. The current episode started more than 1 year ago. The problem  has been resolved since onset. The problem is controlled. Pertinent negatives include no chest pain, headaches, palpitations, shortness of breath or sweats. There are no associated agents to hypertension. Risk factors for coronary artery disease include diabetes mellitus, obesity, sedentary lifestyle and dyslipidemia. Past treatments include ACE inhibitors. The current treatment provides moderate improvement. There are no compliance problems.  Identifiable causes of hypertension include chronic renal disease.  Hyperlipidemia This is a chronic problem. The current episode started more than 1 year ago. The problem is uncontrolled. Recent lipid tests were reviewed and are high. Exacerbating diseases include chronic renal disease, diabetes and obesity. There are no known factors aggravating her hyperlipidemia. Pertinent negatives include no chest pain, myalgias or shortness of breath. Current antihyperlipidemic treatment includes statins. The current treatment provides moderate improvement of lipids. Compliance problems: sometimes forgets to take her cholesterol pill.  Risk factors for coronary artery disease include  diabetes mellitus, dyslipidemia, hypertension, obesity and a sedentary lifestyle.    Review of systems  Constitutional: + decreasing body weight,  current Body mass index is 30.41 kg/m. , no fatigue, no subjective hyperthermia, no subjective hypothermia Eyes: no blurry vision, no xerophthalmia ENT: no sore throat, no nodules palpated in throat, no dysphagia/odynophagia, no hoarseness Cardiovascular: no chest pain, no shortness of breath, no palpitations, no leg swelling Respiratory: no cough, no shortness of breath Gastrointestinal: no nausea/vomiting/diarrhea Musculoskeletal: no muscle/joint aches Skin: no rashes, no hyperemia Neurological: no tremors, no numbness, no tingling, no dizziness Psychiatric: no depression, no anxiety   Objective:    BP 100/66 (BP Location: Left Arm,  Patient Position: Sitting)   Pulse 86   Ht 5' 6 (1.676 m)   Wt 188 lb 6.4 oz (85.5 kg)   LMP 01/20/2012   BMI 30.41 kg/m   Wt Readings from Last 3 Encounters:  03/23/24 188 lb 6.4 oz (85.5 kg)  11/24/23 201 lb 9.6 oz (91.4 kg)  07/22/23 203 lb 6.4 oz (92.3 kg)    BP Readings from Last 3 Encounters:  03/23/24 100/66  11/24/23 118/74  07/22/23 94/62     Physical Exam- Limited  Constitutional:  Body mass index is 30.41 kg/m. , not in acute distress, normal state of mind Eyes:  EOMI, no exophthalmos Musculoskeletal: no gross deformities, strength intact in all four extremities, no gross restriction of joint movements Skin:  no rashes, no hyperemia Neurological: no tremor with outstretched hands   Diabetic Foot Exam - Simple   No data filed      Recent Results (from the past 2160 hours)  POCT UA - Microalbumin     Status: Abnormal   Collection Time: 03/23/24  9:06 AM  Result Value Ref Range   Microalbumin Ur, POC 80 mg/L mg/L   Creatinine, POC 300 mg/dL mg/dL   Albumin/Creatinine Ratio, Urine, POC 30-300 mg/G   HgB A1c     Status: Abnormal   Collection Time: 03/23/24  9:07 AM  Result Value Ref Range   Hemoglobin A1C 5.9 (A) 4.0 - 5.6 %   HbA1c POC (<> result, manual entry)     HbA1c, POC (prediabetic range)     HbA1c, POC (controlled diabetic range)         Lipid Panel     Component Value Date/Time   CHOL 201 (A) 11/21/2020 0000   TRIG 90 03/12/2023 0000   HDL 50 07/14/2018 0000   LDLCALC 91 03/12/2023 0000      Assessment & Plan:   1) Controlled type 2 diabetes mellitus with complication, without long-term current use of insulin (HCC)  - Patient has currently uncontrolled symptomatic type 2 DM since 53 years of age.  She presents today with her CGM showing at target glycemic profile.  Her POCT A1c today is 5.9%, improving from last visit of 6.6%.  Analysis of her CGM shows TIR 97%, TAR 3%, TBR 0% with a GMI of 6.3%.  She has lost 13 lbs since last  visit and generally is feeling well.  She has tolerated the switch to Mounjaro  well, no unpleasant side effects.  -I reviewed her recent labs which include normal renal function.  POCT UM shows mild microalbuminuria.   Her diabetes is complicated by obesity/sedentary life and patient remains at a high risk for more acute and chronic complications of diabetes which include CAD, CVA, CKD, retinopathy, and neuropathy. These are all discussed in detail with the patient.  - Nutritional counseling repeated  at each appointment due to patients tendency to fall back in to old habits.  - The patient admits there is a room for improvement in their diet and drink choices. -  Suggestion is made for the patient to avoid simple carbohydrates from their diet including Cakes, Sweet Desserts / Pastries, Ice Cream, Soda (diet and regular), Sweet Tea, Candies, Chips, Cookies, Sweet Pastries, Store Bought Juices, Alcohol in Excess of 1-2 drinks a day, Artificial Sweeteners, Coffee Creamer, and Sugar-free Products. This will help patient to have stable blood glucose profile and potentially avoid unintended weight gain.   - I encouraged the patient to switch to unprocessed or minimally processed complex starch and increased protein intake (animal or plant source), fruits, and vegetables.   - Patient is advised to stick to a routine mealtimes to eat 3 meals a day and avoid unnecessary snacks (to snack only to correct hypoglycemia).  - I have approached patient with the following individualized plan to manage diabetes and patient agrees:   -She is advised to continue Metformin  1000 mg ER daily with breakfast and Mounjaro  7.5 mg SQ weekly.  -She is encouraged to continue monitoring blood glucose at least once daily, before breakfast maybe 3 times a week to keep an eye on how well she is doing.  She has CGM, has benefited from such device, is advised to continue.  2) BP/HTN: Her blood pressure is controlled to target.   She is advised to continue her Lisinopril 10 mg p.o. daily (may take 0.5 of her current 20 mg tabs).   3) Lipids/HPL:  Her most recent lipid panel from 03/12/23 shows controlled LDL of 90 (improved).    She is advised to continue Atorvastatin  20 mg po daily at bedtime.  Side effects and precautions discussed with her.    4)  Weight/Diet:  Her Body mass index is 30.41 kg/m.-- clearly complicating her DM management.  She is a candidate for modest weight loss.  CDE Consult in progress, exercise, and detailed carbohydrates information provided.     5) Chronic Care/Health Maintenance: -Patient is on ACEI and statin encouraged to continue to follow up with Ophthalmology, Podiatrist at least yearly or according to recommendations, and advised to stay away from smoking. I have recommended yearly flu vaccine and pneumonia vaccination at least every 5 years; moderate intensity exercise for up to 150 minutes weekly; and  sleep for at least 7 hours a day.  6) Vitamin D  deficiency Recent Vitamin D  level was 27.  She does take supplement daily but 1000 units daily.  I advised her to increase to 5000 units daily.   - I advised patient to maintain close follow up with for primary care needs.    I spent  43  minutes in the care of the patient today including review of labs from CMP, Lipids, Thyroid Function, Hematology (current and previous including abstractions from other facilities); face-to-face time discussing  her blood glucose readings/logs, discussing hypoglycemia and hyperglycemia episodes and symptoms, medications doses, her options of short and long term treatment based on the latest standards of care / guidelines;  discussion about incorporating lifestyle medicine;  and documenting the encounter. Risk reduction counseling performed per USPSTF guidelines to reduce obesity and cardiovascular risk factors.     Please refer to Patient Instructions for Blood Glucose Monitoring and Insulin/Medications  Dosing Guide  in media tab for additional information. Please  also refer to  Patient Self Inventory in the Media  tab for reviewed elements of pertinent  patient history.  Yanique Oo participated in the discussions, expressed understanding, and voiced agreement with the above plans.  All questions were answered to her satisfaction. she is encouraged to contact clinic should she have any questions or concerns prior to her return visit.    Follow up plan: - Return in about 6 months (around 09/22/2024) for Diabetes F/U with A1c in office, No previsit labs.  Benton Rio, [facility] [address] [place], [place] [address] Phone: [phone] Fax: [phone]  03/23/2024, 9:11 AM

## 2024-07-20 ENCOUNTER — Other Ambulatory Visit: Payer: Self-pay | Admitting: Nurse Practitioner

## 2024-09-13 ENCOUNTER — Encounter: Payer: Self-pay | Admitting: Nurse Practitioner

## 2024-09-13 ENCOUNTER — Ambulatory Visit: Admitting: Nurse Practitioner

## 2024-09-13 VITALS — BP 124/74 | HR 80 | Ht 66.0 in | Wt 184.0 lb

## 2024-09-13 DIAGNOSIS — E119 Type 2 diabetes mellitus without complications: Secondary | ICD-10-CM

## 2024-09-13 DIAGNOSIS — Z7985 Long-term (current) use of injectable non-insulin antidiabetic drugs: Secondary | ICD-10-CM

## 2024-09-13 DIAGNOSIS — E559 Vitamin D deficiency, unspecified: Secondary | ICD-10-CM

## 2024-09-13 DIAGNOSIS — Z7984 Long term (current) use of oral hypoglycemic drugs: Secondary | ICD-10-CM | POA: Diagnosis not present

## 2024-09-13 DIAGNOSIS — I10 Essential (primary) hypertension: Secondary | ICD-10-CM

## 2024-09-13 DIAGNOSIS — E782 Mixed hyperlipidemia: Secondary | ICD-10-CM | POA: Diagnosis not present

## 2024-09-13 LAB — POCT GLYCOSYLATED HEMOGLOBIN (HGB A1C): Hemoglobin A1C: 5.9 % — AB (ref 4.0–5.6)

## 2024-09-13 MED ORDER — METFORMIN HCL ER 500 MG PO TB24: 1000.0000 mg | ORAL_TABLET | Freq: Every day | ORAL | 3 refills | Status: AC

## 2024-09-13 MED ORDER — TIRZEPATIDE 10 MG/0.5ML ~~LOC~~ SOAJ: 10.0000 mg | SUBCUTANEOUS | 1 refills | Status: AC

## 2024-09-13 NOTE — Progress Notes (Signed)
 09/13/2024  Endocrinology follow-up note   Subjective:    Patient ID: Stacy Clarke, female    DOB: 1970-10-27. Patient is being seen in follow-up for management of type 2  Diabetes, HTN requested by  Levander Norleen Dover, DO  Past Medical History:  Diagnosis Date   HTN (hypertension) 08/28/2012    Social History   Socioeconomic History   Marital status: Married    Spouse name: Not on file   Number of children: Not on file   Years of education: Not on file   Highest education level: Not on file  Occupational History   Not on file  Tobacco Use   Smoking status: Former   Smokeless tobacco: Never  Vaping Use   Vaping status: Never Used  Substance and Sexual Activity   Alcohol use: No   Drug use: No   Sexual activity: Not on file  Other Topics Concern   Not on file  Social History Narrative   Not on file   Social Drivers of Health   Tobacco Use: Medium Risk (09/13/2024)   Patient History    Smoking Tobacco Use: Former    Smokeless Tobacco Use: Never    Passive Exposure: Not on Actuary Strain: Not on file  Food Insecurity: Not on file  Transportation Needs: Not on file  Physical Activity: Inactive (09/18/2021)   Received from [facility]   Exercise Vital Sign    On average, how many days per week do you engage in moderate to strenuous exercise (like a brisk walk)?: 0 days    On average, how many minutes do you engage in exercise at this level?: 0 min  Stress: No Stress Concern Present (09/18/2021)   Received from [facility] - Occupational Stress Questionnaire    Feeling of Stress : Not at all  Social Connections: Unknown (02/12/2022)   Received from [facility]   Social Network    Social Network: Not on file  Depression (PHQ2-9): Not on file  Alcohol Screen: Not on file  Housing: Not on file  Utilities: Not on file  Health Literacy: Medium Risk (12/23/2023)   Received from  [facility]   Health Literacy    : Rarely   Outpatient Encounter Medications as of 09/13/2024  Medication Sig   aspirin 81 MG EC tablet Take 81 mg by mouth daily. Swallow whole.   atorvastatin  (LIPITOR) 20 MG tablet TAKE 1 TABLET BY MOUTH EVERY DAY   Calcium  Polycarbophil (FIBER-CAPS PO) Take by mouth daily.   cholecalciferol (VITAMIN D3) 25 MCG (1000 UNIT) tablet Take 5,000 Units by mouth daily.   Continuous Glucose Sensor (FREESTYLE LIBRE 3 SENSOR) MISC by Does not apply route as directed. Sensor is changed every 15 days   famotidine (PEPCID) 20 MG tablet Take 20 mg by mouth daily.   folic acid (FOLVITE) 1 MG tablet Take 1 mg by mouth daily.   lisinopril (PRINIVIL,ZESTRIL) 20 MG tablet Take 20 mg by mouth daily. Take 10 mg po daily (1/2 of her current 20 mg tabs)   methotrexate (RHEUMATREX) 2.5 MG tablet once a week. Patient is currently taking 8 tablets a week.   Multiple Vitamins-Minerals (HAIR SKIN AND NAILS FORMULA PO) Take by mouth daily.   Multiple Vitamins-Minerals (WOMENS MULTI GUMMIES PO) Take by mouth daily.   Omega-3 Fatty Acids (FISH OIL) 1000 MG CAPS Take by mouth daily.  ONETOUCH VERIO test strip USE AS INSTRUCTED TO TEST BLOOD GLUCOSE ONE TIME DAILY   PARoxetine (PAXIL) 20 MG tablet Take 20 mg by mouth daily.   tirzepatide  (MOUNJARO ) 10 MG/0.5ML Pen Inject 10 mg into the skin once a week.   triamcinolone cream (KENALOG) 0.1 % Apply 1 application  topically as needed.   vitamin B-12 (CYANOCOBALAMIN) 500 MCG tablet Take 1,000 mcg by mouth daily.   [DISCONTINUED] metFORMIN  (GLUCOPHAGE -XR) 500 MG 24 hr tablet Take 2 tablets (1,000 mg total) by mouth daily with breakfast.   [DISCONTINUED] tirzepatide  (MOUNJARO ) 7.5 MG/0.5ML Pen Inject 7.5 mg into the skin once a week.   metFORMIN  (GLUCOPHAGE -XR) 500 MG 24 hr tablet Take 2 tablets (1,000 mg total) by mouth daily with breakfast.   [DISCONTINUED] Continuous Blood Gluc Sensor (DEXCOM G7 SENSOR) MISC Inject 1 Application into  the skin as directed. Change sensor every 10 days as directed. (Patient not taking: Reported on 03/23/2024)   No facility-administered encounter medications on file as of 09/13/2024.   ALLERGIES: No Known Allergies VACCINATION STATUS: Immunization History  Administered Date(s) Administered   Moderna Sars-Covid-2 Vaccination 04/05/2020, 05/03/2020     Diabetes She presents for her follow-up diabetic visit. She has type 2 diabetes mellitus. Onset time: She was diagnosed at approximate age of 21 years after episodes of gestational diabetes with 3 of her pregnancies from 68 until 2014. Her disease course has been improving. Pertinent negatives for hypoglycemia include no confusion or pallor. Associated symptoms include weight loss. Pertinent negatives for diabetes include no fatigue, no polydipsia, no polyphagia and no polyuria. There are no hypoglycemic complications. Symptoms are stable. There are no diabetic complications. Risk factors for coronary artery disease include diabetes mellitus, hypertension, obesity, sedentary lifestyle and dyslipidemia. Current diabetic treatment includes oral agent (monotherapy) (and Mounjaro ). She is compliant with treatment most of the time. Her weight is fluctuating minimally. She is following a diabetic diet. When asked about meal planning, she reported none. She has not had a previous visit with a dietitian. She participates in exercise intermittently. Her home blood glucose trend is fluctuating minimally. Her overall blood glucose range is 110-130 mg/dl. (She presents today with her CGM showing at target glycemic profile.  Her POCT A1c today is 5.9% unchanged from last visit.  Analysis of her CGM shows TIR 99%, TAR 1%, TBR 0% with a GMI of 6.1%.  She continues to tolerate the Mounjaro  well, notes that some of the food noise is creeping back in.) An ACE inhibitor/angiotensin II receptor blocker is being taken. She does not see a podiatrist.Eye exam is current.     Review of systems  Constitutional: + decreasing body weight,  current Body mass index is 29.7 kg/m. , no fatigue, no subjective hyperthermia, no subjective hypothermia Eyes: no blurry vision, no xerophthalmia ENT: no sore throat, no nodules palpated in throat, no dysphagia/odynophagia, no hoarseness Cardiovascular: no chest pain, no shortness of breath, no palpitations, no leg swelling Respiratory: no cough, no shortness of breath Gastrointestinal: no nausea/vomiting/diarrhea Musculoskeletal: no muscle/joint aches Skin: no rashes, no hyperemia, + hair loss Neurological: no tremors, no numbness, no tingling, no dizziness Psychiatric: no depression, no anxiety   Objective:    BP 124/74   Pulse 80   Ht 5' 6 (1.676 m)   Wt 184 lb (83.5 kg)   LMP 01/20/2012   SpO2 96%   BMI 29.70 kg/m   Wt Readings from Last 3 Encounters:  09/13/24 184 lb (83.5 kg)  03/23/24 188 lb 6.4 oz (  85.5 kg)  11/24/23 201 lb 9.6 oz (91.4 kg)    BP Readings from Last 3 Encounters:  09/13/24 124/74  03/23/24 100/66  11/24/23 118/74     Physical Exam- Limited  Constitutional:  Body mass index is 29.7 kg/m. , not in acute distress, normal state of mind Eyes:  EOMI, no exophthalmos Musculoskeletal: no gross deformities, strength intact in all four extremities, no gross restriction of joint movements Skin:  no rashes, no hyperemia Neurological: no tremor with outstretched hands   Diabetic Foot Exam - Simple   Simple Foot Form Diabetic Foot exam was performed with the following findings: Yes   Visual Inspection No deformities, no ulcerations, no other skin breakdown bilaterally: Yes Sensation Testing Intact to touch and monofilament testing bilaterally: Yes Pulse Check Posterior Tibialis and Dorsalis pulse intact bilaterally: Yes Comments      Recent Results (from the past 2160 hours)  POCT glycosylated hemoglobin (Hb A1C)     Status: Abnormal   Collection Time: 09/13/24  9:40 AM   Result Value Ref Range   Hemoglobin A1C 5.9 (A) 4.0 - 5.6 %   HbA1c POC (<> result, manual entry)     HbA1c, POC (prediabetic range)     HbA1c, POC (controlled diabetic range)          Lipid Panel     Component Value Date/Time   CHOL 201 (A) 11/21/2020 0000   TRIG 90 03/12/2023 0000   HDL 50 07/14/2018 0000   LDLCALC 91 03/12/2023 0000      Assessment & Plan:   1) Controlled type 2 diabetes mellitus with complication, without long-term current use of insulin (HCC)  - Patient has currently uncontrolled symptomatic type 2 DM since 53 years of age.  She presents today with her CGM showing at target glycemic profile.  Her POCT A1c today is 5.9% unchanged from last visit.  Analysis of her CGM shows TIR 99%, TAR 1%, TBR 0% with a GMI of 6.1%.  She continues to tolerate the Mounjaro  well, notes that some of the food noise is creeping back in.  -I reviewed her recent labs which include normal renal function.   Her diabetes is complicated by obesity/sedentary life and patient remains at a high risk for more acute and chronic complications of diabetes which include CAD, CVA, CKD, retinopathy, and neuropathy. These are all discussed in detail with the patient.  - Nutritional counseling repeated/built upon at each appointment.  - The patient admits there is a room for improvement in their diet and drink choices. -  Suggestion is made for the patient to avoid simple carbohydrates from their diet including Cakes, Sweet Desserts / Pastries, Ice Cream, Soda (diet and regular), Sweet Tea, Candies, Chips, Cookies, Sweet Pastries, Store Bought Juices, Alcohol in Excess of 1-2 drinks a day, Artificial Sweeteners, Coffee Creamer, and Sugar-free Products. This will help patient to have stable blood glucose profile and potentially avoid unintended weight gain.   - I encouraged the patient to switch to unprocessed or minimally processed complex starch and increased protein intake (animal or plant  source), fruits, and vegetables.   - Patient is advised to stick to a routine mealtimes to eat 3 meals a day and avoid unnecessary snacks (to snack only to correct hypoglycemia).  - I have approached patient with the following individualized plan to manage diabetes and patient agrees:   -She is advised to continue Metformin  1000 mg ER daily with breakfast and will increase Mounjaro  to 10 mg SQ weekly  to help with the food noise that is creeping up again.  -She is encouraged to continue monitoring blood glucose at least once daily, before breakfast maybe 3 times a week to keep an eye on how well she is doing.  She has CGM, has benefited from such device, is advised to continue.  2) BP/HTN: Her blood pressure is controlled to target.  She is advised to continue her meds as prescribed by PCP.   3) Lipids/HPL:  Her most recent lipid panel from 03/12/23 shows controlled LDL of 90 (improved).    She is advised to continue Atorvastatin  20 mg po daily at bedtime.  Side effects and precautions discussed with her.  Will recheck Lipids prior to next visit.  4)  Weight/Diet:  Her Body mass index is 29.7 kg/m.-- clearly complicating her DM management.  She is a candidate for modest weight loss.  CDE Consult in progress, exercise, and detailed carbohydrates information provided.     5) Chronic Care/Health Maintenance: -Patient is on ACEI and statin encouraged to continue to follow up with Ophthalmology, Podiatrist at least yearly or according to recommendations, and advised to stay away from smoking. I have recommended yearly flu vaccine and pneumonia vaccination at least every 5 years; moderate intensity exercise for up to 150 minutes weekly; and  sleep for at least 7 hours a day.  6) Vitamin D  deficiency Recent Vitamin D  level was 27.  She does take supplement daily but 1000 units daily.  I advised her to increase to 5000 units daily.  Will recheck vitamin d  prior to next visit.   - I advised patient  to maintain close follow up with for primary care needs.  Will also check thyroid levels to rule out thyroid problem as cause of hair loss (most likely related to progressive weight loss).      I spent  32  minutes in the care of the patient today including review of labs from CMP, Lipids, Thyroid Function, Hematology (current and previous including abstractions from other facilities); face-to-face time discussing  her blood glucose readings/logs, discussing hypoglycemia and hyperglycemia episodes and symptoms, medications doses, her options of short and long term treatment based on the latest standards of care / guidelines;  discussion about incorporating lifestyle medicine;  and documenting the encounter. Risk reduction counseling performed per USPSTF guidelines to reduce obesity and cardiovascular risk factors.     Please refer to Patient Instructions for Blood Glucose Monitoring and Insulin/Medications Dosing Guide  in media tab for additional information. Please  also refer to  Patient Self Inventory in the Media  tab for reviewed elements of pertinent patient history.  Stacy Clarke participated in the discussions, expressed understanding, and voiced agreement with the above plans.  All questions were answered to her satisfaction. she is encouraged to contact clinic should she have any questions or concerns prior to her return visit.    Follow up plan: - Return in about 6 months (around 03/14/2025) for Diabetes F/U with A1c in office, Previsit labs.  Benton Rio, [facility] [address] [place], [place] [address] Phone: [phone] Fax: [phone]  09/13/2024, 9:50 AM

## 2025-03-14 ENCOUNTER — Ambulatory Visit: Admitting: Nurse Practitioner
